# Patient Record
Sex: Male | Born: 1935 | Race: White | Hispanic: No | Marital: Single | Smoking: Never smoker
Health system: Southern US, Community
[De-identification: ages and names within clinical notes are randomized; demographics above are authoritative.]

## PROBLEM LIST (undated history)

## (undated) DIAGNOSIS — G309 Alzheimer's disease, unspecified: Secondary | ICD-10-CM

## (undated) DIAGNOSIS — I493 Ventricular premature depolarization: Secondary | ICD-10-CM

## (undated) DIAGNOSIS — E785 Hyperlipidemia, unspecified: Secondary | ICD-10-CM

## (undated) DIAGNOSIS — I350 Nonrheumatic aortic (valve) stenosis: Secondary | ICD-10-CM

## (undated) DIAGNOSIS — N189 Chronic kidney disease, unspecified: Secondary | ICD-10-CM

## (undated) DIAGNOSIS — I10 Essential (primary) hypertension: Secondary | ICD-10-CM

## (undated) DIAGNOSIS — F329 Major depressive disorder, single episode, unspecified: Secondary | ICD-10-CM

## (undated) DIAGNOSIS — I248 Other forms of acute ischemic heart disease: Secondary | ICD-10-CM

## (undated) DIAGNOSIS — F0280 Dementia in other diseases classified elsewhere without behavioral disturbance: Secondary | ICD-10-CM

## (undated) HISTORY — PX: TRANSURETHRAL RESECTION OF PROSTATE: SHX73

---

## 2013-02-04 ENCOUNTER — Ambulatory Visit: Payer: Self-pay | Admitting: Internal Medicine

## 2015-10-27 ENCOUNTER — Emergency Department: Payer: Medicare Other

## 2015-10-27 ENCOUNTER — Inpatient Hospital Stay
Admission: EM | Admit: 2015-10-27 | Discharge: 2015-11-02 | DRG: 299 | Disposition: A | Discharge status: Hospice | Payer: Medicare Other | Attending: Internal Medicine | Admitting: Internal Medicine

## 2015-10-27 DIAGNOSIS — H409 Unspecified glaucoma: Secondary | ICD-10-CM | POA: Diagnosis present

## 2015-10-27 DIAGNOSIS — L97529 Non-pressure chronic ulcer of other part of left foot with unspecified severity: Secondary | ICD-10-CM | POA: Diagnosis present

## 2015-10-27 DIAGNOSIS — I129 Hypertensive chronic kidney disease with stage 1 through stage 4 chronic kidney disease, or unspecified chronic kidney disease: Secondary | ICD-10-CM | POA: Diagnosis present

## 2015-10-27 DIAGNOSIS — Y95 Nosocomial condition: Secondary | ICD-10-CM | POA: Diagnosis present

## 2015-10-27 DIAGNOSIS — N390 Urinary tract infection, site not specified: Secondary | ICD-10-CM | POA: Diagnosis present

## 2015-10-27 DIAGNOSIS — N179 Acute kidney failure, unspecified: Secondary | ICD-10-CM | POA: Diagnosis present

## 2015-10-27 DIAGNOSIS — D631 Anemia in chronic kidney disease: Secondary | ICD-10-CM | POA: Diagnosis present

## 2015-10-27 DIAGNOSIS — R310 Gross hematuria: Secondary | ICD-10-CM | POA: Diagnosis present

## 2015-10-27 DIAGNOSIS — E860 Dehydration: Secondary | ICD-10-CM | POA: Diagnosis present

## 2015-10-27 DIAGNOSIS — R0600 Dyspnea, unspecified: Secondary | ICD-10-CM | POA: Insufficient documentation

## 2015-10-27 DIAGNOSIS — Z66 Do not resuscitate: Secondary | ICD-10-CM | POA: Diagnosis present

## 2015-10-27 DIAGNOSIS — J984 Other disorders of lung: Secondary | ICD-10-CM | POA: Diagnosis present

## 2015-10-27 DIAGNOSIS — N3289 Other specified disorders of bladder: Secondary | ICD-10-CM | POA: Diagnosis not present

## 2015-10-27 DIAGNOSIS — I82432 Acute embolism and thrombosis of left popliteal vein: Secondary | ICD-10-CM | POA: Diagnosis present

## 2015-10-27 DIAGNOSIS — I10 Essential (primary) hypertension: Secondary | ICD-10-CM | POA: Diagnosis not present

## 2015-10-27 DIAGNOSIS — N401 Enlarged prostate with lower urinary tract symptoms: Secondary | ICD-10-CM | POA: Diagnosis present

## 2015-10-27 DIAGNOSIS — Z515 Encounter for palliative care: Secondary | ICD-10-CM | POA: Diagnosis present

## 2015-10-27 DIAGNOSIS — R0902 Hypoxemia: Secondary | ICD-10-CM

## 2015-10-27 DIAGNOSIS — R338 Other retention of urine: Secondary | ICD-10-CM | POA: Diagnosis present

## 2015-10-27 DIAGNOSIS — F0280 Dementia in other diseases classified elsewhere without behavioral disturbance: Secondary | ICD-10-CM | POA: Diagnosis present

## 2015-10-27 DIAGNOSIS — R1310 Dysphagia, unspecified: Secondary | ICD-10-CM | POA: Diagnosis present

## 2015-10-27 DIAGNOSIS — N138 Other obstructive and reflux uropathy: Secondary | ICD-10-CM | POA: Diagnosis present

## 2015-10-27 DIAGNOSIS — B957 Other staphylococcus as the cause of diseases classified elsewhere: Secondary | ICD-10-CM | POA: Diagnosis present

## 2015-10-27 DIAGNOSIS — Z7901 Long term (current) use of anticoagulants: Secondary | ICD-10-CM | POA: Insufficient documentation

## 2015-10-27 DIAGNOSIS — I82413 Acute embolism and thrombosis of femoral vein, bilateral: Secondary | ICD-10-CM | POA: Diagnosis present

## 2015-10-27 DIAGNOSIS — I350 Nonrheumatic aortic (valve) stenosis: Secondary | ICD-10-CM | POA: Diagnosis present

## 2015-10-27 DIAGNOSIS — G309 Alzheimer's disease, unspecified: Secondary | ICD-10-CM | POA: Diagnosis present

## 2015-10-27 DIAGNOSIS — E785 Hyperlipidemia, unspecified: Secondary | ICD-10-CM | POA: Diagnosis present

## 2015-10-27 DIAGNOSIS — E440 Moderate protein-calorie malnutrition: Secondary | ICD-10-CM | POA: Diagnosis present

## 2015-10-27 DIAGNOSIS — Z882 Allergy status to sulfonamides status: Secondary | ICD-10-CM | POA: Diagnosis not present

## 2015-10-27 DIAGNOSIS — R0989 Other specified symptoms and signs involving the circulatory and respiratory systems: Secondary | ICD-10-CM

## 2015-10-27 DIAGNOSIS — J9600 Acute respiratory failure, unspecified whether with hypoxia or hypercapnia: Secondary | ICD-10-CM | POA: Diagnosis not present

## 2015-10-27 DIAGNOSIS — J9601 Acute respiratory failure with hypoxia: Secondary | ICD-10-CM | POA: Diagnosis not present

## 2015-10-27 DIAGNOSIS — Z79899 Other long term (current) drug therapy: Secondary | ICD-10-CM | POA: Diagnosis not present

## 2015-10-27 DIAGNOSIS — I824Z2 Acute embolism and thrombosis of unspecified deep veins of left distal lower extremity: Secondary | ICD-10-CM | POA: Diagnosis present

## 2015-10-27 DIAGNOSIS — F05 Delirium due to known physiological condition: Secondary | ICD-10-CM | POA: Diagnosis present

## 2015-10-27 DIAGNOSIS — I82403 Acute embolism and thrombosis of unspecified deep veins of lower extremity, bilateral: Secondary | ICD-10-CM | POA: Diagnosis not present

## 2015-10-27 DIAGNOSIS — I493 Ventricular premature depolarization: Secondary | ICD-10-CM | POA: Diagnosis present

## 2015-10-27 DIAGNOSIS — F329 Major depressive disorder, single episode, unspecified: Secondary | ICD-10-CM | POA: Diagnosis present

## 2015-10-27 DIAGNOSIS — J690 Pneumonitis due to inhalation of food and vomit: Secondary | ICD-10-CM | POA: Diagnosis not present

## 2015-10-27 DIAGNOSIS — N183 Chronic kidney disease, stage 3 (moderate): Secondary | ICD-10-CM | POA: Diagnosis present

## 2015-10-27 DIAGNOSIS — Z7982 Long term (current) use of aspirin: Secondary | ICD-10-CM | POA: Diagnosis not present

## 2015-10-27 DIAGNOSIS — J189 Pneumonia, unspecified organism: Secondary | ICD-10-CM

## 2015-10-27 DIAGNOSIS — I248 Other forms of acute ischemic heart disease: Secondary | ICD-10-CM

## 2015-10-27 DIAGNOSIS — N281 Cyst of kidney, acquired: Secondary | ICD-10-CM | POA: Diagnosis present

## 2015-10-27 DIAGNOSIS — G8929 Other chronic pain: Secondary | ICD-10-CM | POA: Diagnosis present

## 2015-10-27 DIAGNOSIS — I5030 Unspecified diastolic (congestive) heart failure: Secondary | ICD-10-CM

## 2015-10-27 DIAGNOSIS — Z8611 Personal history of tuberculosis: Secondary | ICD-10-CM

## 2015-10-27 DIAGNOSIS — R011 Cardiac murmur, unspecified: Secondary | ICD-10-CM | POA: Diagnosis not present

## 2015-10-27 DIAGNOSIS — N19 Unspecified kidney failure: Secondary | ICD-10-CM | POA: Diagnosis present

## 2015-10-27 HISTORY — DX: Alzheimer's disease, unspecified: G30.9

## 2015-10-27 HISTORY — DX: Other forms of acute ischemic heart disease: I24.8

## 2015-10-27 HISTORY — DX: Ventricular premature depolarization: I49.3

## 2015-10-27 HISTORY — DX: Nonrheumatic aortic (valve) stenosis: I35.0

## 2015-10-27 HISTORY — DX: Hyperlipidemia, unspecified: E78.5

## 2015-10-27 HISTORY — DX: Essential (primary) hypertension: I10

## 2015-10-27 HISTORY — DX: Major depressive disorder, single episode, unspecified: F32.9

## 2015-10-27 HISTORY — DX: Dementia in other diseases classified elsewhere, unspecified severity, without behavioral disturbance, psychotic disturbance, mood disturbance, and anxiety: F02.80

## 2015-10-27 HISTORY — DX: Chronic kidney disease, unspecified: N18.9

## 2015-10-27 LAB — URINALYSIS COMPLETE WITH MICROSCOPIC (ARMC ONLY)
BILIRUBIN URINE: NEGATIVE
Bacteria, UA: NONE SEEN
GLUCOSE, UA: NEGATIVE mg/dL
KETONES UR: NEGATIVE mg/dL
NITRITE: NEGATIVE
PH: 5 (ref 5.0–8.0)
Protein, ur: NEGATIVE mg/dL
SPECIFIC GRAVITY, URINE: 1.012 (ref 1.005–1.030)
Squamous Epithelial / LPF: NONE SEEN

## 2015-10-27 LAB — CBC WITH DIFFERENTIAL/PLATELET
Basophils Absolute: 0.1 10*3/uL (ref 0–0.1)
Basophils Relative: 0 %
Eosinophils Absolute: 0.1 10*3/uL (ref 0–0.7)
Eosinophils Relative: 0 %
HEMATOCRIT: 34.8 % — AB (ref 40.0–52.0)
HEMOGLOBIN: 11.7 g/dL — AB (ref 13.0–18.0)
LYMPHS ABS: 1.6 10*3/uL (ref 1.0–3.6)
MCH: 29.1 pg (ref 26.0–34.0)
MCHC: 33.7 g/dL (ref 32.0–36.0)
MCV: 86.4 fL (ref 80.0–100.0)
MONO ABS: 1 10*3/uL (ref 0.2–1.0)
NEUTROS ABS: 13 10*3/uL — AB (ref 1.4–6.5)
PLATELETS: 159 10*3/uL (ref 150–440)
RBC: 4.03 MIL/uL — ABNORMAL LOW (ref 4.40–5.90)
RDW: 14.9 % — AB (ref 11.5–14.5)
WBC: 15.6 10*3/uL — ABNORMAL HIGH (ref 3.8–10.6)

## 2015-10-27 LAB — BASIC METABOLIC PANEL
ANION GAP: 11 (ref 5–15)
BUN: 91 mg/dL — ABNORMAL HIGH (ref 6–20)
CALCIUM: 8.9 mg/dL (ref 8.9–10.3)
CO2: 20 mmol/L — ABNORMAL LOW (ref 22–32)
Chloride: 108 mmol/L (ref 101–111)
Creatinine, Ser: 4.41 mg/dL — ABNORMAL HIGH (ref 0.61–1.24)
GFR, EST AFRICAN AMERICAN: 12 mL/min — AB (ref >60)
GFR, EST NON AFRICAN AMERICAN: 10 mL/min — AB (ref >60)
Glucose, Bld: 119 mg/dL — ABNORMAL HIGH (ref 65–99)
POTASSIUM: 4.9 mmol/L (ref 3.5–5.1)
Sodium: 139 mmol/L (ref 135–145)

## 2015-10-27 LAB — HEPARIN LEVEL (UNFRACTIONATED): HEPARIN UNFRACTIONATED: 0.95 [IU]/mL — AB (ref 0.30–0.70)

## 2015-10-27 LAB — LACTIC ACID, PLASMA: Lactic Acid, Venous: 1.6 mmol/L (ref 0.5–2.0)

## 2015-10-27 LAB — APTT: aPTT: >160 seconds (ref 24–36)

## 2015-10-27 LAB — TROPONIN I
TROPONIN I: 0.08 ng/mL — AB (ref <0.031)
Troponin I: 0.06 ng/mL — ABNORMAL HIGH (ref <0.031)
Troponin I: 0.69 ng/mL — ABNORMAL HIGH (ref <0.031)

## 2015-10-27 LAB — BRAIN NATRIURETIC PEPTIDE: B Natriuretic Peptide: 158 pg/mL — ABNORMAL HIGH (ref 0.0–100.0)

## 2015-10-27 LAB — HEMOGLOBIN
Hemoglobin: 11.5 g/dL — ABNORMAL LOW (ref 13.0–18.0)
Hemoglobin: 12.3 g/dL — ABNORMAL LOW (ref 13.0–18.0)

## 2015-10-27 LAB — PROTIME-INR
INR: 1.42
Prothrombin Time: 17.4 seconds — ABNORMAL HIGH (ref 11.4–15.0)

## 2015-10-27 LAB — PLATELET COUNT: Platelets: 152 10*3/uL (ref 150–440)

## 2015-10-27 LAB — MRSA PCR SCREENING: MRSA BY PCR: NEGATIVE

## 2015-10-27 MED ORDER — POLYETHYLENE GLYCOL 3350 17 G PO PACK
17.0000 g | PACK | Freq: Every day | ORAL | Status: DC | PRN
  Filled 2015-10-27: qty 1

## 2015-10-27 MED ORDER — LAMOTRIGINE 25 MG PO TABS
25.0000 mg | ORAL_TABLET | Freq: Every day | ORAL | Status: DC
  Administered 2015-10-27 – 2015-10-30 (×4): 25 mg via ORAL
  Filled 2015-10-27 (×4): qty 1

## 2015-10-27 MED ORDER — VANCOMYCIN HCL IN DEXTROSE 1-5 GM/200ML-% IV SOLN
1000.0000 mg | INTRAVENOUS | Status: DC
  Filled 2015-10-27: qty 200

## 2015-10-27 MED ORDER — OMEGA-3-ACID ETHYL ESTERS 1 G PO CAPS
1.0000 g | ORAL_CAPSULE | Freq: Every day | ORAL | Status: DC
  Administered 2015-10-27 – 2015-10-31 (×5): 1 g via ORAL
  Filled 2015-10-27 (×5): qty 1

## 2015-10-27 MED ORDER — LATANOPROST 0.005 % OP SOLN
1.0000 [drp] | Freq: Every day | OPHTHALMIC | Status: DC
  Administered 2015-10-27 – 2015-11-01 (×5): 1 [drp] via OPHTHALMIC
  Filled 2015-10-27: qty 2.5

## 2015-10-27 MED ORDER — ONDANSETRON HCL 4 MG/2ML IJ SOLN
4.0000 mg | Freq: Four times a day (QID) | INTRAMUSCULAR | Status: DC | PRN
Start: 2015-10-27 — End: 2015-11-02

## 2015-10-27 MED ORDER — TAMSULOSIN HCL 0.4 MG PO CAPS
0.4000 mg | ORAL_CAPSULE | Freq: Every day | ORAL | Status: DC
  Administered 2015-10-27 – 2015-10-31 (×5): 0.4 mg via ORAL
  Filled 2015-10-27 (×5): qty 1

## 2015-10-27 MED ORDER — DORZOLAMIDE HCL 2 % OP SOLN
1.0000 [drp] | Freq: Two times a day (BID) | OPHTHALMIC | Status: DC
Start: 2015-10-27 — End: 2015-11-02
  Administered 2015-10-27 – 2015-11-02 (×11): 1 [drp] via OPHTHALMIC
  Filled 2015-10-27: qty 10

## 2015-10-27 MED ORDER — MEMANTINE HCL 10 MG PO TABS
10.0000 mg | ORAL_TABLET | Freq: Two times a day (BID) | ORAL | Status: DC
  Administered 2015-10-27 – 2015-10-31 (×9): 10 mg via ORAL
  Filled 2015-10-27 (×10): qty 1

## 2015-10-27 MED ORDER — ZINC SULFATE 220 (50 ZN) MG PO CAPS
220.0000 mg | ORAL_CAPSULE | Freq: Two times a day (BID) | ORAL | Status: DC
  Administered 2015-10-27 – 2015-10-31 (×8): 220 mg via ORAL
  Filled 2015-10-27 (×8): qty 1

## 2015-10-27 MED ORDER — ACETAMINOPHEN 325 MG PO TABS
650.0000 mg | ORAL_TABLET | Freq: Every day | ORAL | Status: DC
  Administered 2015-10-27 – 2015-10-30 (×4): 650 mg via ORAL
  Filled 2015-10-27 (×4): qty 2

## 2015-10-27 MED ORDER — HEPARIN (PORCINE) IN NACL 100-0.45 UNIT/ML-% IJ SOLN
1200.0000 [IU]/h | INTRAMUSCULAR | Status: AC
  Administered 2015-10-27: 1350 [IU]/h via INTRAVENOUS
  Administered 2015-10-28 – 2015-10-29 (×3): 1200 [IU]/h via INTRAVENOUS
  Filled 2015-10-27 (×4): qty 250

## 2015-10-27 MED ORDER — ESCITALOPRAM OXALATE 10 MG PO TABS
10.0000 mg | ORAL_TABLET | Freq: Every day | ORAL | Status: DC
  Administered 2015-10-28: 10 mg via ORAL
  Filled 2015-10-27: qty 1

## 2015-10-27 MED ORDER — SODIUM CHLORIDE 0.9 % IV SOLN
INTRAVENOUS | Status: DC
  Administered 2015-10-27 – 2015-10-31 (×4): via INTRAVENOUS

## 2015-10-27 MED ORDER — SIMVASTATIN 40 MG PO TABS
40.0000 mg | ORAL_TABLET | Freq: Every day | ORAL | Status: DC
  Administered 2015-10-27: 40 mg via ORAL
  Filled 2015-10-27: qty 1

## 2015-10-27 MED ORDER — ADULT MULTIVITAMIN W/MINERALS CH
1.0000 | ORAL_TABLET | Freq: Every day | ORAL | Status: DC
  Administered 2015-10-28 – 2015-10-31 (×4): 1 via ORAL
  Filled 2015-10-27 (×4): qty 1

## 2015-10-27 MED ORDER — SODIUM CHLORIDE 0.9 % IV BOLUS (SEPSIS): 500.0000 mL | Freq: Once | INTRAVENOUS | Status: DC

## 2015-10-27 MED ORDER — ACETAMINOPHEN 325 MG PO TABS
650.0000 mg | ORAL_TABLET | Freq: Four times a day (QID) | ORAL | Status: DC | PRN
  Administered 2015-11-01 (×2): 650 mg via ORAL
  Filled 2015-10-27 (×2): qty 2

## 2015-10-27 MED ORDER — HEPARIN BOLUS VIA INFUSION
4000.0000 [IU] | Freq: Once | INTRAVENOUS | Status: AC
  Administered 2015-10-27: 4000 [IU] via INTRAVENOUS
  Filled 2015-10-27: qty 4000

## 2015-10-27 MED ORDER — SODIUM CHLORIDE 0.9% FLUSH
3.0000 mL | Freq: Two times a day (BID) | INTRAVENOUS | Status: DC
  Administered 2015-10-28 – 2015-11-01 (×8): 3 mL via INTRAVENOUS

## 2015-10-27 MED ORDER — AMLODIPINE BESYLATE 5 MG PO TABS
5.0000 mg | ORAL_TABLET | Freq: Every day | ORAL | Status: DC
  Administered 2015-10-28 – 2015-10-31 (×4): 5 mg via ORAL
  Filled 2015-10-27 (×4): qty 1

## 2015-10-27 MED ORDER — ACETAMINOPHEN 650 MG RE SUPP: 650.0000 mg | Freq: Four times a day (QID) | RECTAL | Status: DC | PRN

## 2015-10-27 MED ORDER — PIPERACILLIN-TAZOBACTAM 3.375 G IVPB 30 MIN
3.3750 g | Freq: Once | INTRAVENOUS | Status: DC
  Filled 2015-10-27: qty 50

## 2015-10-27 MED ORDER — DEXTROSE 5 % IV SOLN
1.0000 g | INTRAVENOUS | Status: DC
  Administered 2015-10-27 – 2015-10-28 (×2): 1 g via INTRAVENOUS
  Filled 2015-10-27 (×3): qty 10

## 2015-10-27 MED ORDER — SENNOSIDES-DOCUSATE SODIUM 8.6-50 MG PO TABS
1.0000 | ORAL_TABLET | Freq: Every evening | ORAL | Status: DC | PRN
  Administered 2015-10-30: 1 via ORAL
  Filled 2015-10-27: qty 1

## 2015-10-27 MED ORDER — ONDANSETRON HCL 4 MG PO TABS: 4.0000 mg | ORAL_TABLET | Freq: Four times a day (QID) | ORAL | Status: DC | PRN

## 2015-10-27 NOTE — Progress Notes (Signed)
Pharmacy Antibiotic Note  Maurice Carney is a 82 y.o. male admitted on 10/27/2015 with UTI.  Pharmacy has been consulted for ceftriaxone dosing.  Plan: Ceftriaxone 1 g IV q24h   Height: 6\' 4"  (193 cm) Weight: 175 lb (79.379 kg) IBW/kg (Calculated) : 86.8  Temp (24hrs), Avg:98.2 F (36.8 C), Min:98.2 F (36.8 C), Max:98.2 F (36.8 C)   Recent Labs Lab 10/27/15 0925  WBC 15.6*  CREATININE 4.41*  LATICACIDVEN 1.6    Estimated Creatinine Clearance: 10.8 mL/min (by C-G formula based on Cr of 4.41).    Allergies  Allergen Reactions  . Sulfa Antibiotics    Antimicrobials this admission: ceftriaxone 5/26 >>   Dose adjustments this admission:  Microbiology results: 5/26 BCx: Sent 5/26 UCx: Sent   Thank you for allowing pharmacy to be a part of this patient's care.  Cindi CarbonMary M Ronte Parker, PharmD Clinical Pharmacist 10/27/2015 12:41 PM

## 2015-10-27 NOTE — ED Notes (Signed)
Pt came to ED via EMS from [place]. Pt has chronic back pain, but reports it has increasingly gotten worse. Pt has strong urine odor.

## 2015-10-27 NOTE — Progress Notes (Signed)
Patient with >1000 cc urine in bladder [facility] placed [facility]

## 2015-10-27 NOTE — Progress Notes (Signed)
Patient has bloody urine output. Notified Dr. Juliene PinaMody. No new orders at this will continue to monitor. Patient daughter concerned with patient aspirating. Patient's family requesting a speech swallow eval. MD made aware. MD will place orders. Rudean Haskellonnisha R Imari Sivertsen

## 2015-10-27 NOTE — Consult Note (Signed)
[facility] Vascular Consult Note  MRN : [id number]  Maurice Carney is a 82 y.o. (March 31, 1917) male who presents with chief complaint of  Chief Complaint  Patient presents with  . Back Pain  .  History of Present Illness: I am asked to see the patient by Dr. Juliene Pina regarding an extensive left lower extremity DVT and consideration for IVC filter. I am also asked to comment on some small ulcerations on his left second third toes. His pain and swelling in his left leg started a few days ago in this regard. He has been having trouble with his legs for many years he states. His walking has gotten much worse over the past several years this point when asked how much walking he does he says not much. It sounds like he has become nonambulatory. He is accompanied by his grandson today and the patient provide some history reasonably well. He has no chest pain or shortness of breath. He came in for back pain and was found to have severe urinary retention with over a liter of urine in his bladder. He was in acute on chronic renal failure as well. A duplex was performed which showed a very extensive left lower extremity DVT and he has been started on anticoagulation. At the time of diagnosis he was not on any anticoagulants. He has no evidence of pulmonary embolus and does not appear to have poor pulmonary reserve.  Current Facility-Administered Medications  Medication Dose Route Frequency Provider Last Rate Last Dose  . 0.9 %  sodium chloride infusion   Intravenous Continuous Adrian Saran, MD 75 mL/hr at 10/27/15 1236    . acetaminophen (TYLENOL) tablet 650 mg  650 mg Oral Q6H PRN Adrian Saran, MD       Or  . acetaminophen (TYLENOL) suppository 650 mg  650 mg Rectal Q6H PRN Adrian Saran, MD      . acetaminophen (TYLENOL) tablet 650 mg  650 mg Oral QHS Sital Mody, MD      . amLODipine (NORVASC) tablet 5 mg  5 mg Oral Daily Adrian Saran, MD   5 mg at 10/27/15 1626  . cefTRIAXone (ROCEPHIN) 1 g  in dextrose 5 % 50 mL IVPB  1 g Intravenous Q24H Cindi Carbon, [facility]   Stopped at 10/27/15 1522  . dorzolamide (TRUSOPT) 2 % ophthalmic solution 1 drop  1 drop Both Eyes BID Sital Mody, MD      . escitalopram (LEXAPRO) tablet 10 mg  10 mg Oral Daily Sital Mody, MD   10 mg at 10/27/15 1626  . heparin ADULT infusion 100 units/mL (25000 units/211mL sodium chloride 0.45%)  1,350 Units/hr Intravenous Continuous Richardean Canal, MD 13.5 mL/hr at 10/27/15 1142 1,350 Units/hr at 10/27/15 1142  . lamoTRIgine (LAMICTAL) tablet 25 mg  25 mg Oral QHS Sital Mody, MD      . latanoprost (XALATAN) 0.005 % ophthalmic solution 1 drop  1 drop Both Eyes QHS Sital Mody, MD      . memantine (NAMENDA) tablet 10 mg  10 mg Oral BID Adrian Saran, MD   10 mg at 10/27/15 1535  . multivitamin with minerals tablet 1 tablet  1 tablet Oral Daily Adrian Saran, MD   1 tablet at 10/27/15 1628  . omega-3 acid ethyl esters (LOVAZA) capsule 1 g  1 g Oral Daily Adrian Saran, MD   1 g at 10/27/15 1535  . ondansetron (ZOFRAN) tablet 4 mg  4 mg Oral Q6H PRN Adrian Saran, MD  Or  . ondansetron (ZOFRAN) injection 4 mg  4 mg Intravenous Q6H PRN Sital Mody, MD      . polyethylene glycol (MIRALAX / GLYCOLAX) packet 17 g  17 g Oral Daily PRN Adrian Saran, MD      . senna-docusate (Senokot-S) tablet 1 tablet  1 tablet Oral QHS PRN Adrian Saran, MD      . simvastatin (ZOCOR) tablet 40 mg  40 mg Oral QHS Sital Mody, MD      . sodium chloride flush (NS) 0.9 % injection 3 mL  3 mL Intravenous Q12H Sital Mody, MD   3 mL at 10/27/15 1200  . tamsulosin (FLOMAX) capsule 0.4 mg  0.4 mg Oral Daily Sital Mody, MD      . zinc sulfate capsule 220 mg  220 mg Oral BID Adrian Saran, MD   220 mg at 10/27/15 1630    Past Medical History  Diagnosis Date  . Hypertension   . Hyperlipidemia   . Alzheimer disease   . MDD (major depressive disorder) (HCC)   . CKD (chronic kidney disease)     History reviewed. No pertinent past surgical history.  Social History Social  History  Substance Use Topics  . Smoking status: Never Smoker   . Smokeless tobacco: None  . Alcohol Use: No  No IVDU  Family History No known family history of bleeding disorders, clotting disorders, or aneurysms  Allergies  Allergen Reactions  . Sulfa Antibiotics      REVIEW OF SYSTEMS (Negative unless checked)  Constitutional: [] Weight loss  [] Fever  [] Chills Cardiac: [] Chest pain   [] Chest pressure   [] Palpitations   [] Shortness of breath when laying flat   [] Shortness of breath at rest   [] Shortness of breath with exertion. Vascular:  [] Pain in legs with walking   [] Pain in legs at rest   [] Pain in legs when laying flat   [] Claudication   [] Pain in feet when walking  [] Pain in feet at rest  [] Pain in feet when laying flat   [x] History of DVT   [] Phlebitis   [x] Swelling in legs   [] Varicose veins   [] Non-healing ulcers Pulmonary:   [] Uses home oxygen   [] Productive cough   [] Hemoptysis   [] Wheeze  [] COPD   [] Asthma Neurologic:  [] Dizziness  [] Blackouts   [] Seizures   [] History of stroke   [] History of TIA  [] Aphasia   [] Temporary blindness   [] Dysphagia   [] Weakness or numbness in arms   [] Weakness or numbness in legs Musculoskeletal:  [] Arthritis   [] Joint swelling   [] Joint pain   [] Low back pain Hematologic:  [] Easy bruising  [] Easy bleeding   [] Hypercoagulable state   [] Anemic  [] Hepatitis Gastrointestinal:  [] Blood in stool   [] Vomiting blood  [] Gastroesophageal reflux/heartburn   [] Difficulty swallowing. Genitourinary:  [x] Chronic kidney disease   [] Difficult urination  [] Frequent urination  [] Burning with urination   [x] Blood in urine Skin:  [] Rashes   [x] Ulcers   [] Wounds Psychological:  [] History of anxiety   []  History of major depression.  Physical Examination  Filed Vitals:   10/27/15 1300 10/27/15 1400 10/27/15 1500 10/27/15 1612  BP: 177/71 163/68 162/69 155/61  Pulse: 78   92  Temp:    97.5 F (36.4 C)  TempSrc:    Oral  Resp: 21 20 21 24   Height:       Weight:      SpO2: 96%   88%   Body mass index is 21.31 kg/(m^2). Gen:  WD/WN, NAD Head:  [place]/AT, No temporalis wasting. Prominent temp pulse not noted. Ear/Nose/Throat: Hearing grossly intact, nares w/o erythema or drainage, oropharynx w/o Erythema/Exudate Eyes: PERRLA, EOMI.  Neck: Supple, no nuchal rigidity.  No JVD.  Pulmonary:  Good air movement, equal bilaterally.  Cardiac: RRR, normal S1, S2 Vascular:  Vessel Right Left  Radial Palpable Palpable  Ulnar Palpable Palpable  Brachial Palpable Palpable  Carotid Palpable, without bruit Palpable, without bruit  Aorta Not palpable N/A  Femoral Palpable Palpable  Popliteal Palpable Not Palpable  PT Not Palpable Not Palpable  DP Palpable Weakly Palpable   Gastrointestinal: soft, non-tender/non-distended. No guarding/reflex. No masses, surgical incisions, or scars. Musculoskeletal: M/S 5/5 throughout.  Extremities without ischemic changes.  No deformity or atrophy. 3+ LLE edema.  No RLE edema. Neurologic: CN 2-12 intact. Pain and light touch intact in extremities.  Symmetrical.  Speech is fluent. Motor exam as listed above. Psychiatric: Judgment intact, Mood & affect appropriate for pt's clinical situation. Dermatologic: small shallow ulcers on the tips of toes two and three on the left foot Lymph : No Cervical, Axillary, or Inguinal lymphadenopathy.    CBC Lab Results  Component Value Date   WBC 15.6* 10/27/2015   HGB 11.5* 10/27/2015   HCT 34.8* 10/27/2015   MCV 86.4 10/27/2015   PLT 152 10/27/2015    BMET    Component Value Date/Time   NA 139 10/27/2015 0925   K 4.9 10/27/2015 0925   CL 108 10/27/2015 0925   CO2 20* 10/27/2015 0925   GLUCOSE 119* 10/27/2015 0925   BUN 91* 10/27/2015 0925   CREATININE 4.41* 10/27/2015 0925   CALCIUM 8.9 10/27/2015 0925   GFRNONAA 10* 10/27/2015 0925   GFRAA 12* 10/27/2015 0925   Estimated Creatinine Clearance: 10.8 mL/min (by C-G formula based on Cr of 4.41).  COAG Lab Results   Component Value Date   INR 1.42 10/27/2015    Radiology Dg Chest 2 View  10/27/2015  CLINICAL DATA:  Worsening back pain. Shortness of breath. Leukocytosis. EXAM: CHEST  2 VIEW COMPARISON:  None, including search under other medical record numbers. FINDINGS: There is extensive opacification of right chest with pleural calcification, pleural thickening, and volume loss. The left lung is hyperinflated but clear. No convincing left-sided pleural calcification right-sided opacity could all be chronic, but cannot exclude pneumonia, complex pleural effusion, or malignancy. Normal heart size and mediastinal contours. IMPRESSION: Extensive opacification of the right chest, constellation of findings suggesting fibrothorax. Cannot exclude superimposed acute or malignant pleural parenchymal disease and chest CT is suggested. Electronically Signed   By: Marnee Spring M.D.   On: 10/27/2015 10:36   Dg Lumbar Spine Complete  10/27/2015  CLINICAL DATA:  Worsening low back pain and shortness-of-breath today. EXAM: LUMBAR SPINE - COMPLETE 4+ VIEW COMPARISON:  None. FINDINGS: Mild curvature of the lumbar spine convex to the right. Vertebral body heights are within normal. There is moderate spondylosis throughout the lumbar spine to include facet arthropathy. Moderate disc space narrowing at the L2-3, L3-4 L4-5 levels. Significant calcified plaque over the abdominal aorta and iliac arteries. IMPRESSION: Moderate spondylosis of the lumbar spine with significant disc disease from the L2-3 level to the L4-5 level. Mild curvature convex right. Electronically Signed   By: Elberta Fortis M.D.   On: 10/27/2015 10:46   US Venous Img Lower Unilateral Left  10/27/2015  CLINICAL DATA:  Left leg swelling. EXAM: LEFT LOWER EXTREMITY VENOUS DOPPLER ULTRASOUND TECHNIQUE: Gray-scale sonography with graded compression, as well as color Doppler and duplex ultrasound  were performed to evaluate the lower extremity deep venous systems from  the level of the common femoral vein and including the common femoral, femoral, profunda femoral, popliteal and calf veins including the posterior tibial, peroneal and gastrocnemius veins when visible. The superficial great saphenous vein was also interrogated. Spectral Doppler was utilized to evaluate flow at rest and with distal augmentation maneuvers in the common femoral, femoral and popliteal veins. COMPARISON:  None. FINDINGS: Contralateral Common Femoral Vein: Occlusive thrombus noted within the right common femoral vein. Common Femoral Vein: Occlusive thrombus noted. Saphenofemoral Junction: Occlusive thrombus noted. Profunda Femoral Vein: Occlusive thrombus noted. Femoral Vein: Occlusive thrombus noted. Popliteal Vein: A occlusive thrombus noted. Calf Veins: Occlusive thrombus noted. Superficial Great Saphenous Vein: Non a well visualized. Venous Reflux:  None. Other Findings:  None. IMPRESSION: Extensive occlusive thrombus throughout the left lower extremity venous system. Occlusive thrombus is also noted in the contralateral right common femoral vein. These results will be called to the ordering clinician or representative by the Radiologist Assistant, and communication documented in the PACS or zVision Dashboard. Electronically Signed   By: Charlett NoseKevin  Dover M.D.   On: 10/27/2015 10:16      Assessment/Plan 1. Extensive LLE DVT.  Developed this from prolonged immobility and he is not really walking now so I'm not sure what his fall risk is. He was not on anticoagulation at the time of DVT. At this point, he is on heparin. That would be appropriate therapy in a patient with a new DVT that was provoked and not on anticoagulation at the time of diagnosis with no clear pulmonary embolus or poor pulmonary reserve. I would recommend continued heparin. Given the extensive swelling and discomfort in his leg, he should be on anticoagulation if at all possible even if only for a short period of time. If he  develops bleeding well on anticoagulation and IVC filter should then be placed. If it is determined going for that he is more of a fall risk a filter may have to be placed in the future as well. 2. Left lower extremity pain and swelling. From #1. Continue elevation and once he has been on anticoagulation for 48 hours consider compression socks. 3. Renal failure. Acute on chronic. Hard to imagine a 82 year old would do good with dialysis so hopefully this will improve. 4. Small ulcerations on the tips of toes 2 and 3 on the left foot. His pronounced swelling from his DVT may be contributing as well. These are in the area of arterial insufficiency ulcers, but at this point with his renal failure angiography would be contraindicated. I'll be happy to see him in the office for noninvasive studies for further evaluation.   DEW,JASON, MD  10/27/2015 5:54 PM

## 2015-10-27 NOTE — Progress Notes (Signed)
Nurse reports that patient with hematuria. Had trauma during FOLEY insettion. Will check hgb and if he continues to have hematuria then will need to D?C heparin gtt. AWAITING VASC consult. Will page them to discuss.

## 2015-10-27 NOTE — Progress Notes (Signed)
Notified pharmacy of aPTT level >160. Per pharmacy will redraw labs and adjust levels as needed. Will continue to monitor. Maurice Carney

## 2015-10-27 NOTE — Progress Notes (Signed)
PT Cancellation Note  Patient Details Name: Maurice Carney MRN: [id number] DOB: 07-01-1916   Cancelled Treatment:    Reason Eval/Treat Not Completed: Patient declined, no reason specified Pt is laying in bed with family present.  They all agree that given how weak he is and how sore his legs remain from DTVs that he should wait until tomorrow for eval.  [name] [name], DPT 10/27/2015, 4:58 PM
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
## 2015-10-27 NOTE — NC FL2 (Signed)
[place] MEDICAID FL2 LEVEL OF CARE SCREENING TOOL     IDENTIFICATION  Patient Name: Maurice Carney Birthdate: [birth year] Sex: male Admission Date (Current Location): 10/27/2015  [address] and [place] Number:  Chiropodist and Address:  [facility], [address], [place], [place] [address]      Provider Number: [id number]  Attending Physician Name and Address:  Adrian Saran, MD  Relative Name and Phone Number:       Current Level of Care: Hospital Recommended Level of Care: Skilled Nursing Facility, Assisted Living Facility Prior Approval Number:    Date Approved/Denied:   PASRR Number:  [id number]  Discharge Plan: SNF    Current Diagnoses: Patient Active Problem List   Diagnosis Date Noted  . Renal failure 10/27/2015    Orientation RESPIRATION BLADDER Height & Weight     Self, Time, Situation, Place  Normal Indwelling catheter Weight: 175 lb (79.379 kg) Height:   (193 cm)  BEHAVIORAL SYMPTOMS/MOOD NEUROLOGICAL BOWEL NUTRITION STATUS      Incontinent Diet (normal)  AMBULATORY STATUS COMMUNICATION OF NEEDS Skin   Extensive Assist Verbally Normal                       Personal Care Assistance Level of Assistance  Bathing, Feeding, Dressing, Total care Bathing Assistance: Maximum assistance Feeding assistance: Limited assistance Dressing Assistance: Maximum assistance Total Care Assistance: Maximum assistance   Functional Limitations Info  Sight, Hearing, Speech Sight Info: Impaired Hearing Info: Adequate Speech Info: Adequate    SPECIAL CARE FACTORS FREQUENCY                       Contractures      Additional Factors Info  Allergies   Allergies Info: Sulpha antibiotics           Current Medications (10/27/2015):  This is the current hospital active medication list Current Facility-Administered Medications  Medication Dose Route Frequency Provider Last Rate Last Dose  . 0.9 %  sodium chloride  infusion   Intravenous Continuous Adrian Saran, MD 75 mL/hr at 10/27/15 1236    . acetaminophen (TYLENOL) tablet 650 mg  650 mg Oral Q6H PRN Adrian Saran, MD       Or  . acetaminophen (TYLENOL) suppository 650 mg  650 mg Rectal Q6H PRN Adrian Saran, MD      . acetaminophen (TYLENOL) tablet 650 mg  650 mg Oral QHS Sital Mody, MD      . amLODipine (NORVASC) tablet 5 mg  5 mg Oral Daily Sital Mody, MD      . cefTRIAXone (ROCEPHIN) 1 g in dextrose 5 % 50 mL IVPB  1 g Intravenous Q24H Cindi Carbon, RPH 100 mL/hr at 10/27/15 1231 1 g at 10/27/15 1231  . dorzolamide (TRUSOPT) 2 % ophthalmic solution 1 drop  1 drop Both Eyes BID Sital Mody, MD      . escitalopram (LEXAPRO) tablet 10 mg  10 mg Oral Daily Sital Mody, MD      . heparin ADULT infusion 100 units/mL (25000 units/236mL sodium chloride 0.45%)  1,350 Units/hr Intravenous Continuous Richardean Canal, MD 13.5 mL/hr at 10/27/15 1142 1,350 Units/hr at 10/27/15 1142  . lamoTRIgine (LAMICTAL) tablet 25 mg  25 mg Oral QHS Sital Mody, MD      . latanoprost (XALATAN) 0.005 % ophthalmic solution 1 drop  1 drop Both Eyes QHS Adrian Saran, MD      . memantine (NAMENDA) tablet  10 mg  10 mg Oral BID Adrian SaranSital Mody, MD      . multivitamin with minerals tablet 1 tablet  1 tablet Oral Daily Sital Mody, MD      . omega-3 acid ethyl esters (LOVAZA) capsule 1 g  1 g Oral Daily Sital Mody, MD      . ondansetron (ZOFRAN) tablet 4 mg  4 mg Oral Q6H PRN Adrian SaranSital Mody, MD       Or  . ondansetron (ZOFRAN) injection 4 mg  4 mg Intravenous Q6H PRN Sital Mody, MD      . polyethylene glycol (MIRALAX / GLYCOLAX) packet 17 g  17 g Oral Daily PRN Adrian SaranSital Mody, MD      . senna-docusate (Senokot-S) tablet 1 tablet  1 tablet Oral QHS PRN Adrian SaranSital Mody, MD      . simvastatin (ZOCOR) tablet 40 mg  40 mg Oral QHS Sital Mody, MD      . sodium chloride 0.9 % bolus 500 mL  500 mL Intravenous Once Richardean Canalavid H Yao, MD      . sodium chloride flush (NS) 0.9 % injection 3 mL  3 mL Intravenous Q12H Sital Mody, MD       . tamsulosin (FLOMAX) capsule 0.4 mg  0.4 mg Oral Daily Sital Mody, MD      . zinc sulfate capsule 220 mg  220 mg Oral BID Adrian SaranSital Mody, MD       Current Outpatient Prescriptions  Medication Sig Dispense Refill  . acetaminophen (TYLENOL) 325 MG tablet Take 650 mg by mouth at bedtime.    Marland Kitchen. amLODipine (NORVASC) 5 MG tablet Take 5 mg by mouth daily.    Marland Kitchen. aspirin EC 81 MG tablet Take 81 mg by mouth daily.    . dorzolamide (TRUSOPT) 2 % ophthalmic solution Place 1 drop into both eyes 2 (two) times daily.    Marland Kitchen. escitalopram (LEXAPRO) 10 MG tablet Take 10 mg by mouth daily.    Marland Kitchen. lamoTRIgine (LAMICTAL) 25 MG tablet Take 25 mg by mouth daily.    Marland Kitchen. latanoprost (XALATAN) 0.005 % ophthalmic solution Place 1 drop into both eyes at bedtime.    . memantine (NAMENDA) 10 MG tablet Take 10 mg by mouth 2 (two) times daily.    . Multiple Vitamin (MULTIVITAMIN WITH MINERALS) TABS tablet Take 1 tablet by mouth daily.    Marland Kitchen. neomycin-bacitracin-polymyxin (NEOSPORIN) ointment Apply 1 application topically daily. Apply to left 3rd toe daily until healed.    Marland Kitchen. omega-3 acid ethyl esters (LOVAZA) 1 g capsule Take 1 g by mouth daily.    . polyethylene glycol (MIRALAX / GLYCOLAX) packet Take 17 g by mouth daily as needed for mild constipation or moderate constipation.    . simvastatin (ZOCOR) 40 MG tablet Take 40 mg by mouth at bedtime.    Marland Kitchen. zinc sulfate 220 (50 Zn) MG capsule Take 220 mg by mouth 2 (two) times daily.       Discharge Medications: Please see discharge summary for a list of discharge medications.  Relevant Imaging Results:  Relevant Lab Results:   Additional Information SSN [id number]  Arrie SenateBandi, Tynika Luddy RioM, [place]

## 2015-10-27 NOTE — Clinical Social Work Note (Signed)
Clinical Social Work Assessment  Patient Details  Name: Maurice Carney MRN: [id number] Date of Birth: Mar 31, 1917  Date of referral:  10/27/15               Reason for consult:  Facility Placement                Permission sought to share information with:  Family Supports, Customer service manager Permission granted to share information::  Yes, Verbal Permission Granted  Name::     Daughter Sharene Butters [phone]    Agency::  [facility]   Relationship::  yes  Contact Information:  yes  Housing/Transportation Living arrangements for the past 2 months:  [place] of Information:  Patient, Adult Children Patient Interpreter Needed:  None Criminal Activity/Legal Involvement Pertinent to Current Situation/Hospitalization:  No - Comment as needed Significant Relationships:  Adult Children, Spouse Lives with:  Spouse Do you feel safe going back to the place where you live?  Yes Need for family participation in patient care:  Yes (Comment)  Care giving concerns: patient is unable to walk and complained of pain in legs. He was very active up to a week ago.   Social Worker assessment / plan: LCSW met with patient who gave verbal consent to speak to family and facility. He is residing with [facility] with his spouse( she has dementia) Combined patient and wife have support for all their ADL's. Patient in incontinent  (  Temp catheter being placed)A week ago patient was able to walk with his cane/walker now he reports he is weak ,unable to walk and pain in legs. He has good family support.  He has poor eyesight, hard of hearing but able to answer questions well. He is oriented to person,place, situation. His insurance is [employer] operational officer. ( patient being admitted)  Employment status:  Retired Forensic scientist:  Medicare (AARP/Medicare) PT Recommendations:  Not assessed at this time Information / Referral to community resources:  [place]  Patient/Family's Response to care: Would like for his health issues to be looked after  Patient/Family's Understanding of and Emotional Response to Diagnosis, Current Treatment, and Prognosis:  They understand he is having issues with his bladder and decreased appetite and mobility.  Emotional Assessment Appearance:  Appears younger than stated age Attitude/Demeanor/Rapport:   (Polite and oriented x3) Affect (typically observed):  Accepting, Calm Orientation:  Oriented to Self, Oriented to Place, Oriented to  Time, Oriented to Situation (at times forgetful) Alcohol / Substance use:  Never Used Psych involvement (Current and /or in the community):  No (Comment)  Discharge Needs  Concerns to be addressed:  Care Coordination Readmission within the last 30 days:  No Current discharge risk:  None Barriers to Discharge:  Continued Medical Work up   Pompeys Pillar, [place], LCSW 10/27/2015, 1:56 PM

## 2015-10-27 NOTE — Progress Notes (Signed)
ANTICOAGULATION CONSULT NOTE   Pharmacy Consult for Heparin Drip Indication: VTE prophylaxis  Allergies  Allergen Reactions  . Sulfa Antibiotics     Patient Measurements: Height: 6\' 4"  (193 cm) Weight: 175 lb (79.379 kg) IBW/kg (Calculated) : 86.8 Heparin Dosing Weight: 79 kg  Vital Signs: Temp: 97.5 F (36.4 C) (05/26 1612) Temp Source: Oral (05/26 1612) BP: 155/61 mmHg (05/26 1612) Pulse Rate: 92 (05/26 1612)  Labs:  Recent Labs  10/27/15 0925 10/27/15 1618 10/27/15 1830  HGB 11.7* 11.5* 12.3*  HCT 34.8*  --   --   PLT 159 152  --   APTT  --  >160*  --   LABPROT  --  17.4*  --   INR  --  1.42  --   HEPARINUNFRC  --   --  0.95*  CREATININE 4.41*  --   --   TROPONINI 0.06* 0.08*  --     Estimated Creatinine Clearance: 10.8 mL/min (by C-G formula based on Cr of 4.41).   Medical History: Past Medical History  Diagnosis Date  . Hypertension   . Hyperlipidemia   . Alzheimer disease   . MDD (major depressive disorder) (HCC)   . CKD (chronic kidney disease)     Medications:  Scheduled:  . acetaminophen  650 mg Oral QHS  . amLODipine  5 mg Oral Daily  . cefTRIAXone (ROCEPHIN)  IV  1 g Intravenous Q24H  . dorzolamide  1 drop Both Eyes BID  . escitalopram  10 mg Oral Daily  . lamoTRIgine  25 mg Oral QHS  . latanoprost  1 drop Both Eyes QHS  . memantine  10 mg Oral BID  . multivitamin with minerals  1 tablet Oral Daily  . omega-3 acid ethyl esters  1 g Oral Daily  . simvastatin  40 mg Oral QHS  . sodium chloride flush  3 mL Intravenous Q12H  . tamsulosin  0.4 mg Oral Daily  . zinc sulfate  220 mg Oral BID   Infusions:  . sodium chloride 75 mL/hr at 10/27/15 1236  . heparin 1,350 Units/hr (10/27/15 1142)    Assessment: 82 yo male with bilateral DVT's ordered heparin drip for possible PE's. Patient receiving heparin 1350units/hr.   Goal of Therapy:  Heparin level 0.3-0.7 units/ml    Plan:  Will decrease rate by ~2 units/kg/hr to 1200 units/hr.  Will schedule next anti-Xa level for 0330 on 5/27.   Pharmacy will continue to monitor and adjust per consult.   Simpson,Michael L, RPh 10/27/2015,7:12 PM

## 2015-10-27 NOTE — Progress Notes (Signed)
Maurice Carney is a 82 y.o. male patient admitted from ED awake, alert - oriented  X 4 - no acute distress noted.  VSS - Blood pressure 155/61, pulse 92, temperature 97.5 F (36.4 C), temperature source Oral, resp. rate 24, height 6\' 4"  (1.93 m), weight 79.379 kg (175 lb), SpO2 88 %.    IV in place, occlusive dsg intact without redness.  Orientation to room, and floor completed with information packet given to patient/family.  Admission INP armband ID verified with patient/family, and in place.   SR up x 2, fall assessment complete, with patient and family able to verbalize understanding of risk associated with falls, and verbalized understanding to call nsg before up out of bed.  Call light within reach, patient able to voice, and demonstrate understanding.  Skin, clean-dry- intact without evidence of bruising, or skin tears.   No evidence of skin break down noted on exam. Skin assessed with Maddie H. Tele box verification with Olegario MessierKathy, RN and Maddie, RN      Will cont to eval and treat per MD orders.  Rudean Haskellonnisha R Loise Esguerra, RN 10/27/2015 5:27 PM

## 2015-10-27 NOTE — Progress Notes (Signed)
ANTICOAGULATION CONSULT NOTE - Initial Consult  Pharmacy Consult for Heparin Drip Indication: VTE prophylaxis  Allergies  Allergen Reactions  . Sulfa Antibiotics     Patient Measurements: Height: 6\' 4"  (193 cm) Weight: 175 lb (79.379 kg) IBW/kg (Calculated) : 86.8 Heparin Dosing Weight: 79 kg  Vital Signs: Temp: 98.2 F (36.8 C) (05/26 0905) BP: 162/71 mmHg (05/26 1130) Pulse Rate: 76 (05/26 1130)  Labs:  Recent Labs  10/27/15 0925  HGB 11.7*  HCT 34.8*  PLT 159  CREATININE 4.41*  TROPONINI 0.06*    Estimated Creatinine Clearance: 10.8 mL/min (by C-G formula based on Cr of 4.41).   Medical History: Past Medical History  Diagnosis Date  . Hypertension   . Hyperlipidemia   . Alzheimer disease   . MDD (major depressive disorder) (HCC)   . CKD (chronic kidney disease)     Medications:  Scheduled:   Infusions:  . heparin 1,350 Units/hr (10/27/15 1142)  . piperacillin-tazobactam    . sodium chloride    . vancomycin      Assessment: 82 yo male with bilateral DVT's ordered heparin drip for possible PE's.   Goal of Therapy:  Heparin level 0.3-0.7 units/ml    Plan:  Give 4000 units bolus x 1 Start heparin infusion at 1350 units/hr Check anti-Xa level in 8 hours and daily while on heparin Continue to monitor H&H and platelets   Ordered HL for 5/26 at 19:30.  Jeriyah Granlund K, RPh 10/27/2015,11:45 AM

## 2015-10-27 NOTE — H&P (Signed)
[facility] - [place] [facility]   PATIENT NAME: Maurice Carney    MR#:  [id number]  DATE OF BIRTH:  1916/09/01  DATE OF ADMISSION:  10/27/2015  PRIMARY CARE PHYSICIAN: Dr making house calls REQUESTING/REFERRING PHYSICIAN:  Dr Artist Pais  CHIEF COMPLAINT:   Lower extremity edema and back pain HISTORY OF PRESENT ILLNESS:  Maurice Carney  is a 82 y.o. male with a known history of All tremors dementia, chronic back pain and essential hypertension who presents with above complaint. History of present illness is taken from daughter who is at bedside. Patient moved here a few months ago and is now living at assisted living. Yesterday patient was having shortness of breath and daughter noticed that his lower extremities were very edematous. His left lower external he was more edematous than the right. He was given a dose of 40 mg of Lasix by his physician from doctors making house calls. Due to the lower external he swelling and shortness of breath and weakness he presents today along with back pain. Patient reports that his back pain is now controlled. He says he has had back pain for 50 years. Daughter also reports that his oxygen saturation was 85% on room air yesterday. After the dose of Lasix and elevating his legs yesterday his oxygen saturation was 92%. Today in the emergency department his O2 saturations are 96-100%. Lower extremity Dopplers were performed which were positive for DVT. He has not been very ambulatory for the past few months. Patient has a history of tuberculosis and has been treated in the past. He has chronic scarring of the right lung. He is noted to have urinary tract infection on UA today.  PAST MEDICAL HISTORY:   Past Medical History  Diagnosis Date  . Hypertension   . Hyperlipidemia   . Alzheimer disease   . MDD (major depressive disorder) (HCC)   . CKD (chronic kidney disease)     PAST SURGICAL HISTORY:  None reported  SOCIAL HISTORY:    Social History  Substance Use Topics  . Smoking status: No   . Smokeless tobacco: No  . Alcohol Use: No    FAMILY HISTORY:  No reported history of hypertension or diabetes  DRUG ALLERGIES:   Allergies  Allergen Reactions  . Sulfa Antibiotics     REVIEW OF SYSTEMS:   Review of Systems  Constitutional: Negative for fever, chills and malaise/fatigue.  HENT: Negative for ear discharge, ear pain, hearing loss, nosebleeds and sore throat.   Eyes: Negative for blurred vision and pain.  Respiratory: Negative for cough, hemoptysis, shortness of breath and wheezing.   Cardiovascular: Positive for leg swelling. Negative for chest pain and palpitations.  Gastrointestinal: Negative for nausea, vomiting, abdominal pain, diarrhea and blood in stool.  Genitourinary: Negative for dysuria.  Musculoskeletal: Positive for back pain and falls (Multiple falls in the past several weeks).  Skin:       Toes are red left leg  Neurological: Positive for weakness. Negative for dizziness, tremors, speech change, focal weakness, seizures and headaches.  Endo/Heme/Allergies: Does not bruise/bleed easily.  Psychiatric/Behavioral: Positive for memory loss. Negative for depression, suicidal ideas and hallucinations.    MEDICATIONS AT HOME:   Prior to Admission medications   Medication Sig Start Date End Date Taking? Authorizing Provider  acetaminophen (TYLENOL) 325 MG tablet Take 650 mg by mouth at bedtime.   Yes Historical Provider, MD  amLODipine (NORVASC) 5 MG tablet Take 5 mg by mouth daily.   Yes Historical Provider,  MD  aspirin EC 81 MG tablet Take 81 mg by mouth daily.   Yes Historical Provider, MD  dorzolamide (TRUSOPT) 2 % ophthalmic solution Place 1 drop into both eyes 2 (two) times daily.   Yes Historical Provider, MD  escitalopram (LEXAPRO) 10 MG tablet Take 10 mg by mouth daily.   Yes Historical Provider, MD  lamoTRIgine (LAMICTAL) 25 MG tablet Take 25 mg by mouth daily.   Yes Historical  Provider, MD  latanoprost (XALATAN) 0.005 % ophthalmic solution Place 1 drop into both eyes at bedtime.   Yes Historical Provider, MD  memantine (NAMENDA) 10 MG tablet Take 10 mg by mouth 2 (two) times daily.   Yes Historical Provider, MD  Multiple Vitamin (MULTIVITAMIN WITH MINERALS) TABS tablet Take 1 tablet by mouth daily.   Yes Historical Provider, MD  neomycin-bacitracin-polymyxin (NEOSPORIN) ointment Apply 1 application topically daily. Apply to left 3rd toe daily until healed.   Yes Historical Provider, MD  omega-3 acid ethyl esters (LOVAZA) 1 g capsule Take 1 g by mouth daily.   Yes Historical Provider, MD  polyethylene glycol (MIRALAX / GLYCOLAX) packet Take 17 g by mouth daily as needed for mild constipation or moderate constipation.   Yes Historical Provider, MD  simvastatin (ZOCOR) 40 MG tablet Take 40 mg by mouth at bedtime.   Yes Historical Provider, MD  zinc sulfate 220 (50 Zn) MG capsule Take 220 mg by mouth 2 (two) times daily.   Yes Historical Provider, MD      VITAL SIGNS:  Blood pressure 162/71, pulse 76, temperature 98.2 F (36.8 C), resp. rate 19, height 6\' 4"  (1.93 m), weight 79.379 kg (175 lb), SpO2 98 %.  PHYSICAL EXAMINATION:   Physical Exam  Constitutional: He is oriented to person, place, and time and well-developed, well-nourished, and in no distress. No distress.  HENT:  Head: Normocephalic.  Eyes: No scleral icterus.  Neck: Normal range of motion. Neck supple. No JVD present. No tracheal deviation present.  Cardiovascular: Normal rate and regular rhythm.  Exam reveals no gallop and no friction rub.   Murmur heard. Pulmonary/Chest: Effort normal and breath sounds normal. No respiratory distress. He has no wheezes. He has no rales. He exhibits no tenderness.  Abdominal: Soft. Bowel sounds are normal. He exhibits no distension and no mass. There is no tenderness. There is no rebound and no guarding.  Musculoskeletal: Normal range of motion. He exhibits edema  (Left greater than right ). He exhibits no tenderness.  Neurological: He is alert and oriented to person, place, and time.  Skin: No rash noted.  Left foot is cold to touch with faint pulse audible by Doppler toes on left foot are red  Psychiatric: Mood, affect and judgment normal.      LABORATORY PANEL:   CBC  Recent Labs Lab 10/27/15 0925  WBC 15.6*  HGB 11.7*  HCT 34.8*  PLT 159   ------------------------------------------------------------------------------------------------------------------  Chemistries   Recent Labs Lab 10/27/15 0925  NA 139  K 4.9  CL 108  CO2 20*  GLUCOSE 119*  BUN 91*  CREATININE 4.41*  CALCIUM 8.9   ------------------------------------------------------------------------------------------------------------------  Cardiac Enzymes  Recent Labs Lab 10/27/15 0925  TROPONINI 0.06*   ------------------------------------------------------------------------------------------------------------------  RADIOLOGY:  Dg Chest 2 View  10/27/2015  CLINICAL DATA:  Worsening back pain. Shortness of breath. Leukocytosis. EXAM: CHEST  2 VIEW COMPARISON:  None, including search under other medical record numbers. FINDINGS: There is extensive opacification of right chest with pleural calcification, pleural thickening, and volume  loss. The left lung is hyperinflated but clear. No convincing left-sided pleural calcification right-sided opacity could all be chronic, but cannot exclude pneumonia, complex pleural effusion, or malignancy. Normal heart size and mediastinal contours. IMPRESSION: Extensive opacification of the right chest, constellation of findings suggesting fibrothorax. Cannot exclude superimposed acute or malignant pleural parenchymal disease and chest CT is suggested. Electronically Signed   By: Marnee Spring M.D.   On: 10/27/2015 10:36   Dg Lumbar Spine Complete  10/27/2015  CLINICAL DATA:  Worsening low back pain and shortness-of-breath  today. EXAM: LUMBAR SPINE - COMPLETE 4+ VIEW COMPARISON:  None. FINDINGS: Mild curvature of the lumbar spine convex to the right. Vertebral body heights are within normal. There is moderate spondylosis throughout the lumbar spine to include facet arthropathy. Moderate disc space narrowing at the L2-3, L3-4 L4-5 levels. Significant calcified plaque over the abdominal aorta and iliac arteries. IMPRESSION: Moderate spondylosis of the lumbar spine with significant disc disease from the L2-3 level to the L4-5 level. Mild curvature convex right. Electronically Signed   By: Elberta Fortis M.D.   On: 10/27/2015 10:46   US Venous Img Lower Unilateral Left  10/27/2015  CLINICAL DATA:  Left leg swelling. EXAM: LEFT LOWER EXTREMITY VENOUS DOPPLER ULTRASOUND TECHNIQUE: Gray-scale sonography with graded compression, as well as color Doppler and duplex ultrasound were performed to evaluate the lower extremity deep venous systems from the level of the common femoral vein and including the common femoral, femoral, profunda femoral, popliteal and calf veins including the posterior tibial, peroneal and gastrocnemius veins when visible. The superficial great saphenous vein was also interrogated. Spectral Doppler was utilized to evaluate flow at rest and with distal augmentation maneuvers in the common femoral, femoral and popliteal veins. COMPARISON:  None. FINDINGS: Contralateral Common Femoral Vein: Occlusive thrombus noted within the right common femoral vein. Common Femoral Vein: Occlusive thrombus noted. Saphenofemoral Junction: Occlusive thrombus noted. Profunda Femoral Vein: Occlusive thrombus noted. Femoral Vein: Occlusive thrombus noted. Popliteal Vein: A occlusive thrombus noted. Calf Veins: Occlusive thrombus noted. Superficial Great Saphenous Vein: Non a well visualized. Venous Reflux:  None. Other Findings:  None. IMPRESSION: Extensive occlusive thrombus throughout the left lower extremity venous system. Occlusive  thrombus is also noted in the contralateral right common femoral vein. These results will be called to the ordering clinician or representative by the Radiologist Assistant, and communication documented in the PACS or zVision Dashboard. Electronically Signed   By: Charlett Nose M.D.   On: 10/27/2015 10:16    EKG:   Sinus rhythm with atrial premature complex  IMPRESSION AND PLAN:   82 year old male with a history of dementia and hypertension who presents with lower extremity edema and weakness and found to have bilateral DVT and acute kidney injury.  1. Acute kidney injury, unknown baseline creatinine: Possibly related to prerenal azotemia from Lasix. Order bladder scan and renal ultrasound. Start V fluids and repeat creatinine in a.m. Hold nephrotoxic agents Consult nephrology for further evaluation and recommendations  2. Bilateral DVT with fall risk with extensive occlusive thrombus in left lower extremity: Patient is currently on heparin drip. Daughter was consented. Side effects, alternatives and risks were discussed with the daughter. Ultimately patient will need IVC filter fall risk. Consult vascular surgery.  3. Urinary tract infection: Follow up on urine culture and order Rocephin.  4. Left Lower extremity erythema concerning for vascular disease: Consult vascular surgery. Order echocardiogram to evaluate for thrombus.   5. Elevated troponin: This is likely due to demand ischemia and  poor renal clearance. Continue telemetry and follow troponins.  6. Dementia: Continue Namenda  7. Essential hypertension: Continue Norvasc. 8. Glaucoma: Continue eyedrops.  9. Depression: Continue Lexapro and Lamictal. 10. Back pain: No acute issues on x-ray. PT consult and pain control.  11. Abnormal chest x-ray with history of TB: Patient's daughter reports that this is a chronic finding on x-ray.  All the records are reviewed and case discussed with ED provider. Management plans  discussed with the patient and he in agreement Date of care also discussed with daughter at bedside. CODE STATUS: FULL  TOTAL TIME TAKING CARE OF THIS PATIENT: 50 minutes.    Merril Nagy M.D on 10/27/2015 at 11:56 AM  Between 7am to 6pm - Pager - [phone]  After 6pm go to [email or web address] - Social research officer, government  [facility]  Office  [phone]  CC: Primary care physician; No primary care provider on file.

## 2015-10-28 ENCOUNTER — Inpatient Hospital Stay: Payer: Medicare Other

## 2015-10-28 ENCOUNTER — Encounter: Payer: Self-pay | Admitting: Urology

## 2015-10-28 ENCOUNTER — Inpatient Hospital Stay (HOSPITAL_COMMUNITY)
Admit: 2015-10-28 | Discharge: 2015-10-28 | Disposition: A | Discharge status: Home / Self Care | Payer: Medicare Other | Attending: Internal Medicine | Admitting: Internal Medicine

## 2015-10-28 DIAGNOSIS — R310 Gross hematuria: Secondary | ICD-10-CM

## 2015-10-28 DIAGNOSIS — R338 Other retention of urine: Secondary | ICD-10-CM

## 2015-10-28 DIAGNOSIS — R011 Cardiac murmur, unspecified: Secondary | ICD-10-CM

## 2015-10-28 DIAGNOSIS — N3289 Other specified disorders of bladder: Secondary | ICD-10-CM

## 2015-10-28 LAB — HEPARIN LEVEL (UNFRACTIONATED)
HEPARIN UNFRACTIONATED: 0.5 [IU]/mL (ref 0.30–0.70)
HEPARIN UNFRACTIONATED: 0.39 [IU]/mL (ref 0.30–0.70)

## 2015-10-28 LAB — BASIC METABOLIC PANEL
Anion gap: 8 (ref 5–15)
BUN: 72 mg/dL — AB (ref 6–20)
CHLORIDE: 111 mmol/L (ref 101–111)
CO2: 19 mmol/L — ABNORMAL LOW (ref 22–32)
Calcium: 8.1 mg/dL — ABNORMAL LOW (ref 8.9–10.3)
Creatinine, Ser: 2.86 mg/dL — ABNORMAL HIGH (ref 0.61–1.24)
GFR, EST AFRICAN AMERICAN: 20 mL/min — AB (ref >60)
GFR, EST NON AFRICAN AMERICAN: 17 mL/min — AB (ref >60)
Glucose, Bld: 107 mg/dL — ABNORMAL HIGH (ref 65–99)
Potassium: 4.4 mmol/L (ref 3.5–5.1)
SODIUM: 138 mmol/L (ref 135–145)

## 2015-10-28 LAB — ECHOCARDIOGRAM COMPLETE
HEIGHTINCHES: 76 in
WEIGHTICAEL: 2800 [oz_av]

## 2015-10-28 LAB — CBC
HEMATOCRIT: 30.7 % — AB (ref 40.0–52.0)
Hemoglobin: 10.1 g/dL — ABNORMAL LOW (ref 13.0–18.0)
MCH: 28.5 pg (ref 26.0–34.0)
MCHC: 32.8 g/dL (ref 32.0–36.0)
MCV: 87 fL (ref 80.0–100.0)
Platelets: 153 10*3/uL (ref 150–440)
RBC: 3.53 MIL/uL — AB (ref 4.40–5.90)
RDW: 14.4 % (ref 11.5–14.5)
WBC: 16.1 10*3/uL — AB (ref 3.8–10.6)

## 2015-10-28 LAB — TROPONIN I
TROPONIN I: 3.19 ng/mL — AB (ref <0.031)
TROPONIN I: 2.82 ng/mL — AB (ref <0.031)

## 2015-10-28 MED ORDER — ATORVASTATIN CALCIUM 20 MG PO TABS: 20.0000 mg | ORAL_TABLET | Freq: Every day | ORAL | Status: DC

## 2015-10-28 MED ORDER — LORAZEPAM BOLUS VIA INFUSION: 0.2500 mg | Freq: Every evening | INTRAVENOUS | Status: DC | PRN

## 2015-10-28 MED ORDER — METOPROLOL TARTRATE 25 MG PO TABS
12.5000 mg | ORAL_TABLET | Freq: Two times a day (BID) | ORAL | Status: DC
  Administered 2015-10-28 – 2015-11-02 (×10): 12.5 mg via ORAL
  Filled 2015-10-28 (×10): qty 1

## 2015-10-28 MED ORDER — LORAZEPAM 2 MG/ML IJ SOLN
0.2500 mg | Freq: Every evening | INTRAMUSCULAR | Status: DC | PRN
  Administered 2015-10-28: 0.25 mg via INTRAVENOUS
  Filled 2015-10-28: qty 1

## 2015-10-28 NOTE — Evaluation (Signed)
Physical Therapy Evaluation Patient Details Name: Maurice Carney MRN: [id number] DOB: June 18, 1916 Today's Date: 10/28/2015   History of Present Illness  Pt is a 82 y/o male here with renal failure along with L LE DVT.  He has Alzheimer's, hypertension and chronic low back and knee pain.   Clinical Impression  Pt shows good effort with all requested acts but does not do nearly as well as he or his family expected.  Pt loosing balance in sitting and standing and is simply unable to make adjustments despite much cuing/assist.  Pt unable to shift weight forward onto rollator to even stand with anything less than max assist.  Pt pleasant but unsafe and impulsive with mobility. Apparently pt was up with hallucinations all night and may have been even more confused than normal during PT exam (though he was able to nap earlier in the day).     Follow Up Recommendations SNF    Equipment Recommendations  Rolling walker with 5" wheels (4 wheeled walker is not stable enough at this time)    Recommendations for Other Services       Precautions / Restrictions Precautions Precautions: Fall Restrictions Weight Bearing Restrictions: No      Mobility  Bed Mobility Overal bed mobility: Needs Assistance Bed Mobility: Supine to Sit     Supine to sit: Mod assist     General bed mobility comments: Pt shows good effort trying to get up w/o assist but ultiamtely needs heavy UE use and assist from PT to get to EOB.  Pt loosing balance back and to the R in sitting.   Transfers Overall transfer level: Needs assistance Equipment used: 4-wheeled walker Transfers: Sit to/from Stand Sit to Stand: Mod assist;Max assist         General transfer comment: Pt again shows good effort but is weak, confused and unable to get weight forward enough to maintain balance.  Needing considerable direct assist to remain upright.  Ambulation/Gait Ambulation/Gait assistance: Total assist Ambulation Distance (Feet): 1  Feet Assistive device: 4-wheeled walker       General Gait Details: Pt shows some effort in shuffling feet, but simply is unable to appropriate get weight onto walker and leans back heavily while impulsively trying to swing himself over to the recliner.  Pt completely unsafe and ultimately unable to meaningfully assist with what became form of stand-pivot transfer.   Stairs            Wheelchair Mobility    Modified Rankin (Stroke Patients Only)       Balance Overall balance assessment: Needs assistance   Sitting balance-Leahy Scale: Poor       Standing balance-Leahy Scale: Zero                               Pertinent Vitals/Pain Pain Assessment: No/denies pain (reports some general chronic knee/back pain)    Home Living Family/patient expects to be discharged to:: Assisted living               Home Equipment: Walker - 4 wheels Additional Comments: Daughter (who is a Engineer, civil (consulting) and highly involved with pt's care) agrees that he may need a higher level of care than they typically can give    Prior Function Level of Independence: Independent with assistive device(s)         Comments: apparently pt is able to walk and stay relatively active at his ALF despite some confusion, lives  there with his wife.     Hand Dominance        Extremity/Trunk Assessment   Upper Extremity Assessment: Generalized weakness (age appropriate limitations, unable to fully open R hand)           Lower Extremity Assessment: Generalized weakness (R LE grossly 4-/5, L 3+/5)         Communication   Communication: No difficulties  Cognition Arousal/Alertness: Awake/alert Behavior During Therapy: Impulsive Overall Cognitive Status: History of cognitive impairments - at baseline                      General Comments      Exercises        Assessment/Plan    PT Assessment Patient needs continued PT services  PT Diagnosis Difficulty  walking;Generalized weakness   PT Problem List Decreased strength;Decreased range of motion;Decreased balance;Decreased activity tolerance;Decreased mobility;Decreased coordination;Decreased cognition;Decreased knowledge of use of DME;Decreased safety awareness  PT Treatment Interventions DME instruction;Gait training;Stair training;Functional mobility training;Therapeutic activities;Therapeutic exercise;Balance training;Cognitive remediation;Patient/family education   PT Goals (Current goals can be found in the Care Plan section) Acute Rehab PT Goals Patient Stated Goal: family would like him back at the ALF  PT Goal Formulation: With family Time For Goal Achievement: 11/11/15 Potential to Achieve Goals: Fair    Frequency Min 2X/week   Barriers to discharge        Co-evaluation               End of Session Equipment Utilized During Treatment: Gait belt Activity Tolerance: Patient limited by fatigue Patient left: in chair;with call bell/phone within reach;with family/visitor present (family declines alarm, states they will be here 24/7) Nurse Communication: Mobility status         Time: [id number] PT Time Calculation (min) (ACUTE ONLY): 22 min   Charges:   PT Evaluation $PT Eval Moderate Complexity: 1 Procedure     PT G CodesMalachi Pro:        Javian Nudd R Zenola Dezarn, DPT   10/28/2015, 5:23 PM

## 2015-10-28 NOTE — Progress Notes (Signed)
ANTICOAGULATION CONSULT NOTE   Pharmacy Consult for Heparin Drip Indication: VTE prophylaxis  Allergies  Allergen Reactions  . Sulfa Antibiotics     Patient Measurements: Height: 6\' 4"  (193 cm) Weight: 175 lb (79.379 kg) IBW/kg (Calculated) : 86.8 Heparin Dosing Weight: 79 kg  Vital Signs: Temp: 99.2 F (37.3 C) (05/27 1139) Temp Source: Axillary (05/27 1139) BP: 113/47 mmHg (05/27 1139) Pulse Rate: 86 (05/27 1139)  Labs:  Recent Labs  10/27/15 0925 10/27/15 1618 10/27/15 1830 10/27/15 2300 10/28/15 0423 10/28/15 1151  HGB 11.7* 11.5* 12.3*  --  10.1*  --   HCT 34.8*  --   --   --  30.7*  --   PLT 159 152  --   --  153  --   APTT  --  >160*  --   --   --   --   LABPROT  --  17.4*  --   --   --   --   INR  --  1.42  --   --   --   --   HEPARINUNFRC  --   --  0.95*  --  0.50 0.39  CREATININE 4.41*  --   --   --  2.86*  --   TROPONINI 0.06* 0.08*  --  0.69*  --   --     Estimated Creatinine Clearance: 16.6 mL/min (by C-G formula based on Cr of 2.86).   Medical History: Past Medical History  Diagnosis Date  . Hypertension   . Hyperlipidemia   . Alzheimer disease   . MDD (major depressive disorder) (HCC)   . CKD (chronic kidney disease)     Medications:  Scheduled:  . acetaminophen  650 mg Oral QHS  . amLODipine  5 mg Oral Daily  . atorvastatin  20 mg Oral q1800  . cefTRIAXone (ROCEPHIN)  IV  1 g Intravenous Q24H  . dorzolamide  1 drop Both Eyes BID  . escitalopram  10 mg Oral Daily  . lamoTRIgine  25 mg Oral QHS  . latanoprost  1 drop Both Eyes QHS  . memantine  10 mg Oral BID  . multivitamin with minerals  1 tablet Oral Daily  . omega-3 acid ethyl esters  1 g Oral Daily  . sodium chloride flush  3 mL Intravenous Q12H  . tamsulosin  0.4 mg Oral Daily  . zinc sulfate  220 mg Oral BID   Infusions:  . sodium chloride 75 mL/hr at 10/27/15 1236  . heparin 1,200 Units/hr (10/28/15 [id number])    Assessment: 82 yo male with bilateral DVT's ordered  heparin drip for possible PE's. Patient receiving heparin 1350units/hr.   Goal of Therapy:  Heparin level 0.3-0.7 units/ml    Plan:  2nd heparin level was therapeutic as well. Will continue current rate of heparin gtt. Will recheck heparin level and cbc in am.   Pharmacy will continue to monitor and adjust per consult.   Nhyla Nappi D, Pharm.D., BCPS Clinical Pharmacist  10/28/2015,12:19 PM

## 2015-10-28 NOTE — Progress Notes (Signed)
Called by nurse with repeat troponin results. Troponin 3.19. Still suspect this is demand ischemia from infection, extreme urinary retention and possibility of PE. Patient is already on heparin, will continue. Will add B-blocker. Will hold off on aspirin as this may aggrevate the hematuria. Discussed plan with cardiology and they are in agreement. Dr Duke Salviaandolph will see patient in the am. Discussed with son-in -law who was still at hospital to update family.

## 2015-10-28 NOTE — Progress Notes (Signed)
MD Desiree HaneVachani aware of troponin level of 0.69, no new orders received, will continue to monitor pt.

## 2015-10-28 NOTE — Consult Note (Signed)
[facility] CONSULT NOTE    Date: 10/28/2015                  Patient Name:  Maurice SchmidtHarold Baxley  MRN: [id number]  DOB: 01-26-1917  Age / Sex: 82 y.o., male         PCP: No primary care provider on file.                 Service Requesting Consult: Hospitalist, Dr. Juliene PinaMody                 Reason for Consult: Acute renal failure, CKD stage III            History of Present Illness: Patient is a 82 y.o. male with a PMHx of Hypertension, hyperlipidemia, Alzheimer's disease, depression, chronic kidney disease stage III, who was admitted to [facility] on 10/27/2015 for evaluation of bilateral lower extremity edema left greater than right.  Patient unable to provide significant history. History is provided by the patient's daughter who is a Engineer, civil (consulting)nurse. Patient and his [place] moved from [place] to [place] recently. They are residing in an assisted care facility. Recently he's had increasing lower extremity edema. For this he was recently started on Lasix. Unfortunately the edema progressed.  He had a left lower extremity venous duplex performed which demonstrated extensive thrombus throughout the left lower extremity venous system. There was also occlusive thrombus in the contralateral right common femoral vein. He has been started on anticoagulation. The patient had Foley catheter placement however this was apparently in part traumatic.  He apparently has prior history of urethral stricture. A significant amount of urine output was noted immediately after Foley catheter placement therefore he had some urinary retention as well. He now has gross hematuria and the patient is currently on heparin drip for his underlying new right and left lower extremity DVT. The patient's baseline creatinine is 1.5 per the daughter's report. Presenting creatinine was 4.4 and is now down to 2.86 after Foley catheter placement.  Renal ultrasound was performed and was negative for obstruction however there was a  complicated right renal cyst.   Medications: Outpatient medications: Prescriptions prior to admission  Medication Sig Dispense Refill Last Dose  . acetaminophen (TYLENOL) 325 MG tablet Take 650 mg by mouth at bedtime.   10/26/2015 at 2100  . amLODipine (NORVASC) 5 MG tablet Take 5 mg by mouth daily.   10/26/2015 at es  . aspirin EC 81 MG tablet Take 81 mg by mouth daily.   10/26/2015 at 0800  . dorzolamide (TRUSOPT) 2 % ophthalmic solution Place 1 drop into both eyes 2 (two) times daily.   10/26/2015 at 2100  . escitalopram (LEXAPRO) 10 MG tablet Take 10 mg by mouth daily.   10/26/2015 at 0900  . lamoTRIgine (LAMICTAL) 25 MG tablet Take 25 mg by mouth daily.   10/26/2015 at 2100  . latanoprost (XALATAN) 0.005 % ophthalmic solution Place 1 drop into both eyes at bedtime.   10/26/2015 at 2100  . memantine (NAMENDA) 10 MG tablet Take 10 mg by mouth 2 (two) times daily.   10/26/2015 at 2100  . Multiple Vitamin (MULTIVITAMIN WITH MINERALS) TABS tablet Take 1 tablet by mouth daily.   10/26/2015 at 0900  . neomycin-bacitracin-polymyxin (NEOSPORIN) ointment Apply 1 application topically daily. Apply to left 3rd toe daily until healed.   10/26/2015 at Unknown time  . omega-3 acid ethyl esters (LOVAZA) 1 g capsule Take 1 g by mouth daily.   10/26/2015 at  0900  . polyethylene glycol (MIRALAX / GLYCOLAX) packet Take 17 g by mouth daily as needed for mild constipation or moderate constipation.   10/12/2015 at 1129  . simvastatin (ZOCOR) 40 MG tablet Take 40 mg by mouth at bedtime.   10/26/2015 at 2100  . zinc sulfate 220 (50 Zn) MG capsule Take 220 mg by mouth 2 (two) times daily.   10/26/2015 at 2100    Current medications: Current Facility-Administered Medications  Medication Dose Route Frequency Provider Last Rate Last Dose  . 0.9 %  sodium chloride infusion   Intravenous Continuous Adrian Saran, MD 75 mL/hr at 10/27/15 1236    . acetaminophen (TYLENOL) tablet 650 mg  650 mg Oral Q6H PRN Adrian Saran, MD       Or   . acetaminophen (TYLENOL) suppository 650 mg  650 mg Rectal Q6H PRN Adrian Saran, MD      . acetaminophen (TYLENOL) tablet 650 mg  650 mg Oral QHS Adrian Saran, MD   650 mg at 10/27/15 2248  . amLODipine (NORVASC) tablet 5 mg  5 mg Oral Daily Adrian Saran, MD   5 mg at 10/28/15 1000  . atorvastatin (LIPITOR) tablet 20 mg  20 mg Oral q1800 Gracelyn Nurse, MD      . cefTRIAXone (ROCEPHIN) 1 g in dextrose 5 % 50 mL IVPB  1 g Intravenous Q24H Cindi Carbon, RPH 100 mL/hr at 10/28/15 1215 1 g at 10/28/15 1215  . dorzolamide (TRUSOPT) 2 % ophthalmic solution 1 drop  1 drop Both Eyes BID Adrian Saran, MD   1 drop at 10/28/15 1000  . heparin ADULT infusion 100 units/mL (25000 units/248mL sodium chloride 0.45%)  1,200 Units/hr Intravenous Continuous Adrian Saran, MD 12 mL/hr at 10/28/15 0632 1,200 Units/hr at 10/28/15 1610  . lamoTRIgine (LAMICTAL) tablet 25 mg  25 mg Oral QHS Adrian Saran, MD   25 mg at 10/27/15 2248  . latanoprost (XALATAN) 0.005 % ophthalmic solution 1 drop  1 drop Both Eyes QHS Adrian Saran, MD   1 drop at 10/27/15 2247  . LORazepam (ATIVAN) injection 0.25 mg  0.25 mg Intravenous QHS,MR X 1 Gracelyn Nurse, MD      . memantine [facility]) tablet 10 mg  10 mg Oral BID Adrian Saran, MD   10 mg at 10/28/15 1000  . multivitamin with minerals tablet 1 tablet  1 tablet Oral Daily Adrian Saran, MD   1 tablet at 10/28/15 0931  . omega-3 acid ethyl esters (LOVAZA) capsule 1 g  1 g Oral Daily Adrian Saran, MD   1 g at 10/28/15 0931  . ondansetron (ZOFRAN) tablet 4 mg  4 mg Oral Q6H PRN Adrian Saran, MD       Or  . ondansetron (ZOFRAN) injection 4 mg  4 mg Intravenous Q6H PRN Sital Mody, MD      . polyethylene glycol (MIRALAX / GLYCOLAX) packet 17 g  17 g Oral Daily PRN Adrian Saran, MD      . senna-docusate (Senokot-S) tablet 1 tablet  1 tablet Oral QHS PRN Adrian Saran, MD      . sodium chloride flush (NS) 0.9 % injection 3 mL  3 mL Intravenous Q12H Sital Mody, MD   3 mL at 10/28/15 1000  . tamsulosin (FLOMAX) capsule  0.4 mg  0.4 mg Oral Daily Sital Mody, MD   0.4 mg at 10/28/15 1000  . zinc sulfate capsule 220 mg  220 mg Oral BID Adrian Saran, MD   220 mg  at 10/28/15 0931      Allergies: Allergies  Allergen Reactions  . Sulfa Antibiotics       Past Medical History: Past Medical History  Diagnosis Date  . Hypertension   . Hyperlipidemia   . Alzheimer disease   . MDD (major depressive disorder) (HCC)   . CKD (chronic kidney disease)      Past Surgical History: Prior history of urethral stricture s/p intervention  Family History: No family history of ESRD  Social History: Married, recently moved here from [place], lives in an assisted living facility, no current tobacco or ETOH use.    Review of Systems: {atient unable to provide ROS due to dementia.   Vital Signs: Blood pressure 113/47, pulse 86, temperature 99.2 F (37.3 C), temperature source Axillary, resp. rate 18, height 6\' 4"  (1.93 m), weight 79.379 kg (175 lb), SpO2 93 %.  Weight trends: Filed Weights   10/27/15 1101  Weight: 79.379 kg (175 lb)    Physical Exam: General: NAD, resting in bed  Head: Normocephalic, atraumatic.  Eyes: Anicteric, EOMI  Nose: Mucous membranes moist, not inflammed, nonerythematous.  Throat: Oropharynx nonerythematous, no exudate appreciated.   Neck: Supple, trachea midline.  Lungs:  Normal respiratory effort. Clear to auscultation BL without crackles or wheezes.  Heart: RRR. S1 and S2 no rubs  Abdomen:  BS normoactive. Soft, Nondistended, non-tender.  No masses or organomegaly.  Extremities: Left greater than right lower extremity edema  Neurologic: Awake, alert, follows simple commands, Motor strength is 5/5 in the all 4 extremities  Skin: No visible rashes, scars.    Lab results: Basic Metabolic Panel:  Recent Labs Lab 10/27/15 0925 10/28/15 0423  NA 139 138  K 4.9 4.4  CL 108 111  CO2 20* 19*  GLUCOSE 119* 107*  BUN 91* 72*  CREATININE 4.41* 2.86*  CALCIUM 8.9 8.1*     Liver Function Tests: No results for input(s): AST, ALT, ALKPHOS, BILITOT, PROT, ALBUMIN in the last 168 hours. No results for input(s): LIPASE, AMYLASE in the last 168 hours. No results for input(s): AMMONIA in the last 168 hours.  CBC:  Recent Labs Lab 10/27/15 0925 10/27/15 1618 10/27/15 1830 10/28/15 0423  WBC 15.6*  --   --  16.1*  NEUTROABS 13.0*  --   --   --   HGB 11.7* 11.5* 12.3* 10.1*  HCT 34.8*  --   --  30.7*  MCV 86.4  --   --  87.0  PLT 159 152  --  153    Cardiac Enzymes:  Recent Labs Lab 10/27/15 0925 10/27/15 1618 10/27/15 2300  TROPONINI 0.06* 0.08* 0.69*    BNP: Invalid input(s): POCBNP  CBG: No results for input(s): GLUCAP in the last 168 hours.  Microbiology: Results for orders placed or performed during the hospital encounter of 10/27/15  Blood culture (routine x 2)     Status: None (Preliminary result)   Collection Time: 10/27/15  9:25 AM  Result Value Ref Range Status   Specimen Description BLOOD LEFT FOREARM  Final   Special Requests BOTTLES DRAWN AEROBIC AND ANAEROBIC  10CC  Final   Culture NO GROWTH < 24 HOURS  Final   Report Status PENDING  Incomplete  Blood culture (routine x 2)     Status: None (Preliminary result)   Collection Time: 10/27/15  9:25 AM  Result Value Ref Range Status   Specimen Description BLOOD LEFT ASSIST CONTROL  Final   Special Requests BOTTLES DRAWN AEROBIC AND ANAEROBIC  7CC  Final   Culture NO GROWTH < 24 HOURS  Final   Report Status PENDING  Incomplete  MRSA PCR Screening     Status: None   Collection Time: 10/27/15  5:14 PM  Result Value Ref Range Status   MRSA by PCR NEGATIVE NEGATIVE Final    Comment:        The GeneXpert MRSA Assay (FDA approved for NASAL specimens only), is one component of a comprehensive MRSA colonization surveillance program. It is not intended to diagnose MRSA infection nor to guide or monitor treatment for MRSA infections.     Coagulation Studies:  Recent  Labs  10/27/15 1618  LABPROT 17.4*  INR 1.42    Urinalysis:  Recent Labs  10/27/15 0925  COLORURINE YELLOW*  LABSPEC 1.012  PHURINE 5.0  GLUCOSEU NEGATIVE  HGBUR 2+*  BILIRUBINUR NEGATIVE  KETONESUR NEGATIVE  PROTEINUR NEGATIVE  NITRITE NEGATIVE  LEUKOCYTESUR 3+*      Imaging: Dg Chest 2 View  10/27/2015  CLINICAL DATA:  Worsening back pain. Shortness of breath. Leukocytosis. EXAM: CHEST  2 VIEW COMPARISON:  None, including search under other medical record numbers. FINDINGS: There is extensive opacification of right chest with pleural calcification, pleural thickening, and volume loss. The left lung is hyperinflated but clear. No convincing left-sided pleural calcification right-sided opacity could all be chronic, but cannot exclude pneumonia, complex pleural effusion, or malignancy. Normal heart size and mediastinal contours. IMPRESSION: Extensive opacification of the right chest, constellation of findings suggesting fibrothorax. Cannot exclude superimposed acute or malignant pleural parenchymal disease and chest CT is suggested. Electronically Signed   By: Marnee Spring M.D.   On: 10/27/2015 10:36   Dg Lumbar Spine Complete  10/27/2015  CLINICAL DATA:  Worsening low back pain and shortness-of-breath today. EXAM: LUMBAR SPINE - COMPLETE 4+ VIEW COMPARISON:  None. FINDINGS: Mild curvature of the lumbar spine convex to the right. Vertebral body heights are within normal. There is moderate spondylosis throughout the lumbar spine to include facet arthropathy. Moderate disc space narrowing at the L2-3, L3-4 L4-5 levels. Significant calcified plaque over the abdominal aorta and iliac arteries. IMPRESSION: Moderate spondylosis of the lumbar spine with significant disc disease from the L2-3 level to the L4-5 level. Mild curvature convex right. Electronically Signed   By: Elberta Fortis M.D.   On: 10/27/2015 10:46   US Renal  10/28/2015  CLINICAL DATA:  Renal failure EXAM: RENAL / URINARY  TRACT ULTRASOUND COMPLETE COMPARISON:  None. FINDINGS: Right Kidney: Length: 12.4 cm. Echogenicity within normal limits. No hydronephrosis visualized. 5.4 x 4 x 5.3 cm anechoic right renal mass consistent with a cyst. 4.6 x 4.5 x 4.4 cm anechoic right renal mass with thin septations most consistent with a mildly complicated cyst. Left Kidney: Length: 11.7 cm. Echogenicity within normal limits. No hydronephrosis visualized. Two anechoic left renal masses measuring 2.5 x 3 x 2.7 cm and 4.5 x 4.3 x 5.1 cm respectively. Bladder: Bladder wall thickening likely secondary to underdistention with a Foley catheter present. IMPRESSION: 1. No obstructive uropathy. 2. Multiple bilateral renal cysts. 3. Mildly complicated right renal cyst with thin internal septations. Electronically Signed   By: Elige Ko   On: 10/28/2015 12:53   US Venous Img Lower Unilateral Left  10/27/2015  CLINICAL DATA:  Left leg swelling. EXAM: LEFT LOWER EXTREMITY VENOUS DOPPLER ULTRASOUND TECHNIQUE: Gray-scale sonography with graded compression, as well as color Doppler and duplex ultrasound were performed to evaluate the lower extremity deep venous systems from the level  of the common femoral vein and including the common femoral, femoral, profunda femoral, popliteal and calf veins including the posterior tibial, peroneal and gastrocnemius veins when visible. The superficial great saphenous vein was also interrogated. Spectral Doppler was utilized to evaluate flow at rest and with distal augmentation maneuvers in the common femoral, femoral and popliteal veins. COMPARISON:  None. FINDINGS: Contralateral Common Femoral Vein: Occlusive thrombus noted within the right common femoral vein. Common Femoral Vein: Occlusive thrombus noted. Saphenofemoral Junction: Occlusive thrombus noted. Profunda Femoral Vein: Occlusive thrombus noted. Femoral Vein: Occlusive thrombus noted. Popliteal Vein: A occlusive thrombus noted. Calf Veins: Occlusive thrombus  noted. Superficial Great Saphenous Vein: Non a well visualized. Venous Reflux:  None. Other Findings:  None. IMPRESSION: Extensive occlusive thrombus throughout the left lower extremity venous system. Occlusive thrombus is also noted in the contralateral right common femoral vein. These results will be called to the ordering clinician or representative by the Radiologist Assistant, and communication documented in the PACS or zVision Dashboard. Electronically Signed   By: Charlett Nose M.D.   On: 10/27/2015 10:16      Assessment & Plan: Pt is a 82 y.o. male with a PMHx of Hypertension, hyperlipidemia, Alzheimer's disease, depression, chronic kidney disease stage III, who was admitted to [facility] on 10/27/2015 for evaluation of bilateral lower extremity edema left greater than right, found to have acute DVT.   1.  Acute renal failure/CKD stage III. Baseline creatinine reported to [place] as being 1.5. Presenting creatinine was above 4 and is now down to 2.8. Acute renal failure likely related to urinary retention as well as recent furosemide use. Continue IV fluid hydration. No acute indication for dialysis. We will consult with urology given the urinary retention and additional urologic issues.  2.  Urinary retention with subsequent gross hematuria/history of uretheral stricture/Complicated right renal cyst.  Patient with difficult Foley placement. He apparently has history of urethral stricture. He now has gross hematuria. Incidentally he was also found to have a complicated right renal cyst with thin septations. Given the number of urologic issues we will obtain urology consultation.  3. Anemia of chronic kidney disease. Hemoglobin has come down a bit to 10.1. Drop could be related to gross hematuria. Continue to monitor CBC closely.  4. Thanks for consultation.

## 2015-10-28 NOTE — Evaluation (Signed)
Clinical/Bedside Swallow Evaluation Patient Details  Name: Maurice Carney MRN: [id number] Date of Birth: 01-Jan-1917  Today's Date: 10/28/2015 Time: SLP Start Time (ACUTE ONLY): 0911 SLP Stop Time (ACUTE ONLY): 0939 SLP Time Calculation (min) (ACUTE ONLY): 28 min  Past Medical History:  Past Medical History  Diagnosis Date  . Hypertension   . Hyperlipidemia   . Alzheimer disease   . MDD (major depressive disorder) (HCC)   . CKD (chronic kidney disease)    Past Surgical History: History reviewed. No pertinent past surgical history. HPI:      Assessment / Plan / Recommendation Clinical Impression  pt presented with no overt ssx aspiraiton during this assessment. Family reports that pt has been coughing and having increased difficulty at meals but appears to only be at hight. family reports that OT at ALF where pt resides has noticed possbile difficulty with meals at hight. ST educated on fatigue and to limit intake when tired and limit straws as pt intakes too buch liquid with straws. pt and family gave verbal understandign and agreement. SLP to follow up x 1 to assess if pt has any further difficulties.     Aspiration Risk  No limitations    Diet Recommendation Regular;Thin liquid   Liquid Administration via: Cup;No straw Medication Administration: Whole meds with liquid Supervision: Patient able to self feed Compensations: Minimize environmental distractions;Slow rate;Small sips/bites;Follow solids with liquid Postural Changes: Seated upright at 90 degrees    Other  Recommendations Oral Care Recommendations: Patient independent with oral care   Follow up Recommendations    Reg diet thin liquids follow up with slp x1 week    Frequency and Duration min 1 x/week  1 week       Prognosis Prognosis for Safe Diet Advancement: Good      Swallow Study   General Date of Onset: 10/27/15 Type of Study: Bedside Swallow Evaluation Diet Prior to this Study: Regular;Thin  liquids Temperature Spikes Noted: No Respiratory Status: Room air History of Recent Intubation: No Behavior/Cognition: Alert;Cooperative;Pleasant mood Oral Cavity Assessment: Within Functional Limits Oral Care Completed by SLP: No Oral Cavity - Dentition: Adequate natural dentition Vision: Functional for self-feeding Self-Feeding Abilities: Able to feed self Patient Positioning: Upright in bed Baseline Vocal Quality: Normal Volitional Cough: Strong Volitional Swallow: Able to elicit    Oral/Motor/Sensory Function Overall Oral Motor/Sensory Function: Within functional limits   Ice Chips Ice chips: Within functional limits Presentation: Self Fed;Spoon   Thin Liquid Presentation: Cup;Straw;Self Fed    Nectar Thick Nectar Thick Liquid: Not tested   Honey Thick Honey Thick Liquid: Not tested   Puree Puree: Within functional limits Presentation: Self Fed;Spoon   Solid   GO   Solid: Within functional limits Presentation: Self Fed;Spoon        Meredith PelStacie Harris Sauber 10/28/2015,12:15 PM

## 2015-10-28 NOTE — Progress Notes (Addendum)
Foley. NSR. Room air. Heparin at 12. NS @75 . Takes meds whole with water 1-1. Family at the bedside. Pt has no further concerns at this time.

## 2015-10-28 NOTE — Progress Notes (Addendum)
Subjective: Patient admitted with ARF and urinary retention. C/O left leg swelling.  Objective: Vital signs in last 24 hours: Temp:  [98 F (36.7 C)-99.2 F (37.3 C)] 99.2 F (37.3 C) (05/27 1139) Pulse Rate:  [86-96] 86 (05/27 1139) Resp:  [16-18] 18 (05/27 1139) BP: (113-174)/(47-69) 113/47 mmHg (05/27 1139) SpO2:  [89 %-94 %] 93 % (05/27 1139) Weight change:  Last BM Date: 10/27/15  Intake/Output from previous day: 05/26 0701 - 05/27 0700 In: 593.2 [I.V.:543.2; IV Piggyback:50] Out: 7050 [Urine:7050] Intake/Output this shift: Total I/O In: 480 [P.O.:480] Out: -   General appearance: alert, cooperative and no distress Head: Normocephalic, without obvious abnormality, atraumatic Eyes: conjunctivae/corneas clear. PERRL, EOM's intact. Fundi benign. Ears: normal Neck: no JVD and supple, symmetrical, trachea midline Resp: clear to auscultation bilaterally and normal percussion bilaterally Cardio: regular rate and rhythm GI: soft, non-tender; bowel sounds normal; no masses,  no organomegaly Extremities: Left lower ext edema Skin: Skin color, texture, turgor normal. No rashes or lesions Neurologic: Alert and oriented X 3, normal strength and tone. Normal symmetric reflexes. Normal coordination and gait  Lab Results:  Recent Labs  10/27/15 0925 10/27/15 1618 10/27/15 1830 10/28/15 0423  WBC 15.6*  --   --  16.1*  HGB 11.7* 11.5* 12.3* 10.1*  HCT 34.8*  --   --  30.7*  PLT 159 152  --  153   BMET  Recent Labs  10/27/15 0925 10/28/15 0423  NA 139 138  K 4.9 4.4  CL 108 111  CO2 20* 19*  GLUCOSE 119* 107*  BUN 91* 72*  CREATININE 4.41* 2.86*  CALCIUM 8.9 8.1*    Studies/Results: Dg Chest 2 View  10/27/2015  CLINICAL DATA:  Worsening back pain. Shortness of breath. Leukocytosis. EXAM: CHEST  2 VIEW COMPARISON:  None, including search under other medical record numbers. FINDINGS: There is extensive opacification of right chest with pleural calcification,  pleural thickening, and volume loss. The left lung is hyperinflated but clear. No convincing left-sided pleural calcification right-sided opacity could all be chronic, but cannot exclude pneumonia, complex pleural effusion, or malignancy. Normal heart size and mediastinal contours. IMPRESSION: Extensive opacification of the right chest, constellation of findings suggesting fibrothorax. Cannot exclude superimposed acute or malignant pleural parenchymal disease and chest CT is suggested. Electronically Signed   By: Marnee Spring M.D.   On: 10/27/2015 10:36   Dg Lumbar Spine Complete  10/27/2015  CLINICAL DATA:  Worsening low back pain and shortness-of-breath today. EXAM: LUMBAR SPINE - COMPLETE 4+ VIEW COMPARISON:  None. FINDINGS: Mild curvature of the lumbar spine convex to the right. Vertebral body heights are within normal. There is moderate spondylosis throughout the lumbar spine to include facet arthropathy. Moderate disc space narrowing at the L2-3, L3-4 L4-5 levels. Significant calcified plaque over the abdominal aorta and iliac arteries. IMPRESSION: Moderate spondylosis of the lumbar spine with significant disc disease from the L2-3 level to the L4-5 level. Mild curvature convex right. Electronically Signed   By: Elberta Fortis M.D.   On: 10/27/2015 10:46   US Renal  10/28/2015  CLINICAL DATA:  Renal failure EXAM: RENAL / URINARY TRACT ULTRASOUND COMPLETE COMPARISON:  None. FINDINGS: Right Kidney: Length: 12.4 cm. Echogenicity within normal limits. No hydronephrosis visualized. 5.4 x 4 x 5.3 cm anechoic right renal mass consistent with a cyst. 4.6 x 4.5 x 4.4 cm anechoic right renal mass with thin septations most consistent with a mildly complicated cyst. Left Kidney: Length: 11.7 cm. Echogenicity within normal limits. No  hydronephrosis visualized. Two anechoic left renal masses measuring 2.5 x 3 x 2.7 cm and 4.5 x 4.3 x 5.1 cm respectively. Bladder: Bladder wall thickening likely secondary to  underdistention with a Foley catheter present. IMPRESSION: 1. No obstructive uropathy. 2. Multiple bilateral renal cysts. 3. Mildly complicated right renal cyst with thin internal septations. Electronically Signed   By: Elige KoHetal  Patel   On: 10/28/2015 12:53   [place] Venous Img Lower Unilateral Left  10/27/2015  CLINICAL DATA:  Left leg swelling. EXAM: LEFT LOWER EXTREMITY VENOUS DOPPLER ULTRASOUND TECHNIQUE: Gray-scale sonography with graded compression, as well as color Doppler and duplex ultrasound were performed to evaluate the lower extremity deep venous systems from the level of the common femoral vein and including the common femoral, femoral, profunda femoral, popliteal and calf veins including the posterior tibial, peroneal and gastrocnemius veins when visible. The superficial great saphenous vein was also interrogated. Spectral Doppler was utilized to evaluate flow at rest and with distal augmentation maneuvers in the common femoral, femoral and popliteal veins. COMPARISON:  None. FINDINGS: Contralateral Common Femoral Vein: Occlusive thrombus noted within the right common femoral vein. Common Femoral Vein: Occlusive thrombus noted. Saphenofemoral Junction: Occlusive thrombus noted. Profunda Femoral Vein: Occlusive thrombus noted. Femoral Vein: Occlusive thrombus noted. Popliteal Vein: A occlusive thrombus noted. Calf Veins: Occlusive thrombus noted. Superficial Great Saphenous Vein: Non a well visualized. Venous Reflux:  None. Other Findings:  None. IMPRESSION: Extensive occlusive thrombus throughout the left lower extremity venous system. Occlusive thrombus is also noted in the contralateral right common femoral vein. These results will be called to the ordering clinician or representative by the Radiologist Assistant, and communication documented in the PACS or zVision Dashboard. Electronically Signed   By: Charlett NoseKevin  Dover M.D.   On: 10/27/2015 10:16    Medications:  Scheduled: . acetaminophen  650 mg  Oral QHS  . amLODipine  5 mg Oral Daily  . cefTRIAXone (ROCEPHIN)  IV  1 g Intravenous Q24H  . dorzolamide  1 drop Both Eyes BID  . lamoTRIgine  25 mg Oral QHS  . latanoprost  1 drop Both Eyes QHS  . LORazepam  0.25 mg Intravenous QHS,MR X 1  . memantine  10 mg Oral BID  . multivitamin with minerals  1 tablet Oral Daily  . omega-3 acid ethyl esters  1 g Oral Daily  . sodium chloride flush  3 mL Intravenous Q12H  . tamsulosin  0.4 mg Oral Daily  . zinc sulfate  220 mg Oral BID   Continuous: . sodium chloride 75 mL/hr at 10/28/15 1629  . heparin 1,200 Units/hr (10/28/15 16100632)    Assessment/Plan: 1. Acute Renal Failure: Secondary to urinary retention and dehydration. Cr improving with IV hydration and relief of urinary retention. Renal consulted. Renal dose meds for now.  2. Urinary Retention: 3 liters diuresed upon foley placement. Urology consulted. Will probably need foley for several weeks.  3. UTI: On abx. Culture neg so far.  4. Elevated Trop: Likely secondary to ARF. However, second trop slightly more elevated. Will go ahead and cycle two more troponins now that renal function better. If trends further up then will get cards consult. On heparin.  5. Bilateral Lower Ext DVT: On IV heparin. Left leg tightly swollen. Evaluated yesterday for possible IVC filter. Will not proceed with IVC placement unless does not tolerate anticoagulation. Change to oral anticoagulation in next day or two.  Time spent 45 min   LOS: 1 day   Gracelyn NurseJohnston,  Kory Panjwani D 10/28/2015,  4:54 PM

## 2015-10-28 NOTE — Consult Note (Signed)
Subjective: CC: Gross Hematuria  Hx:  Maurice Carney is a delightful 82 yo WM who I was asked to see in consultation by Dr Mady Haagensen for gross hematuria.   Maurice Carney had the onset last week of bilateral lower extremity edema and was [facility] on [facility]ix [facility].   He subsequently was admitted to the hospital with the edema, shortness of breath and back pain.  He was found on admission to have ARI with a Cr of 4.4 and a UTI.   He has a history of a TURP about 25 years ago and there was no reported voiding difficulty prior to admission.   He had a foley placed with some difficulty and subsequently developed gross hematuria after the bladder was drained of of urine.  He was also started on flomax.  He was found to have DVTs and is on Heparin pending placement of a [place] filter when his renal function recovers from the acute obstruction.  He had a renal US that showed bilateral renal cysts with a right renal cyst with internal septations.  The bladder was decompressed around the foley balloon.   The urine in the foley bag is markedly bloody but the most recent urine in the tubing is clearing.  ROS:  Review of Systems  Constitutional: Negative for fever and chills.  Respiratory: Positive for shortness of breath.   Cardiovascular: Negative for chest pain.  Gastrointestinal: Negative for abdominal pain.  All other systems reviewed and are negative.   Allergies  Allergen Reactions  . Sulfa Antibiotics     Past Medical History  Diagnosis Date  . Hypertension   . Hyperlipidemia   . Alzheimer disease   . MDD (major depressive disorder) (HCC)   . CKD (chronic kidney disease)     Past Surgical History  Procedure Laterality Date  . Transurethral resection of prostate      Social History   Social History  . Marital Status: Single    Spouse Name: N/A  . Number of Children: N/A  . Years of Education: N/A   Occupational History  . Not on file.   Social  History Main Topics  . Smoking status: Never Smoker   . Smokeless tobacco: Not on file  . Alcohol Use: No  . Drug Use: Not on file  . Sexual Activity: Not on file   Other Topics Concern  . Not on file   Social History Narrative    History reviewed. No pertinent family history.  Anti-infectives: Anti-infectives    Start     Dose/Rate Route Frequency Ordered Stop   10/27/15 1215  cefTRIAXone (ROCEPHIN) 1 g in dextrose 5 % 50 mL IVPB     1 g 100 mL/hr over 30 Minutes Intravenous Every 24 hours 10/27/15 1204     10/27/15 1115  vancomycin (VANCOCIN) IVPB 1000 mg/200 mL premix  Status:  Discontinued     1,000 mg 200 mL/hr over 60 Minutes Intravenous STAT 10/27/15 1101 10/27/15 1155   10/27/15 1115  piperacillin-tazobactam (ZOSYN) IVPB 3.375 g  Status:  Discontinued     3.375 g 100 mL/hr over 30 Minutes Intravenous  Once 10/27/15 1101 10/27/15 1155      Current Facility-Administered Medications  Medication Dose Route Frequency Provider Last Rate Last Dose  . 0.9 %  sodium chloride infusion   Intravenous Continuous Adrian Saran, MD 75 mL/hr at 10/27/15 1236    . acetaminophen (TYLENOL) tablet 650 mg  650 mg Oral Q6H PRN Sital  Mody, MD       Or  . acetaminophen (TYLENOL) suppository 650 mg  650 mg Rectal Q6H PRN Adrian Saran, MD      . acetaminophen (TYLENOL) tablet 650 mg  650 mg Oral QHS Adrian Saran, MD   650 mg at 10/27/15 2248  . amLODipine (NORVASC) tablet 5 mg  5 mg Oral Daily Adrian Saran, MD   5 mg at 10/28/15 1000  . atorvastatin (LIPITOR) tablet 20 mg  20 mg Oral q1800 Gracelyn Nurse, MD      . cefTRIAXone (ROCEPHIN) 1 g in dextrose 5 % 50 mL IVPB  1 g Intravenous Q24H Cindi Carbon, RPH 100 mL/hr at 10/28/15 1215 1 g at 10/28/15 1215  . dorzolamide (TRUSOPT) 2 % ophthalmic solution 1 drop  1 drop Both Eyes BID Adrian Saran, MD   1 drop at 10/28/15 1000  . heparin ADULT infusion 100 units/mL (25000 units/246mL sodium chloride 0.45%)  1,200 Units/hr Intravenous Continuous Adrian Saran,  MD 12 mL/hr at 10/28/15 0632 1,200 Units/hr at 10/28/15 1610  . lamoTRIgine (LAMICTAL) tablet 25 mg  25 mg Oral QHS Adrian Saran, MD   25 mg at 10/27/15 2248  . latanoprost (XALATAN) 0.005 % ophthalmic solution 1 drop  1 drop Both Eyes QHS Adrian Saran, MD   1 drop at 10/27/15 2247  . LORazepam (ATIVAN) injection 0.25 mg  0.25 mg Intravenous QHS,MR X 1 Gracelyn Nurse, MD      . memantine [facility]) tablet 10 mg  10 mg Oral BID Adrian Saran, MD   10 mg at 10/28/15 1000  . multivitamin with minerals tablet 1 tablet  1 tablet Oral Daily Adrian Saran, MD   1 tablet at 10/28/15 0931  . omega-3 acid ethyl esters (LOVAZA) capsule 1 g  1 g Oral Daily Adrian Saran, MD   1 g at 10/28/15 0931  . ondansetron (ZOFRAN) tablet 4 mg  4 mg Oral Q6H PRN Adrian Saran, MD       Or  . ondansetron (ZOFRAN) injection 4 mg  4 mg Intravenous Q6H PRN Sital Mody, MD      . polyethylene glycol (MIRALAX / GLYCOLAX) packet 17 g  17 g Oral Daily PRN Adrian Saran, MD      . senna-docusate (Senokot-S) tablet 1 tablet  1 tablet Oral QHS PRN Adrian Saran, MD      . sodium chloride flush (NS) 0.9 % injection 3 mL  3 mL Intravenous Q12H Sital Mody, MD   3 mL at 10/28/15 1000  . tamsulosin (FLOMAX) capsule 0.4 mg  0.4 mg Oral Daily Sital Mody, MD   0.4 mg at 10/28/15 1000  . zinc sulfate capsule 220 mg  220 mg Oral BID Adrian Saran, MD   220 mg at 10/28/15 9604    Past medical, surgical, family and social history reviewed.    Objective: Vital signs in last 24 hours: Temp:  [98 F (36.7 C)-99.2 F (37.3 C)] 99.2 F (37.3 C) (05/27 1139) Pulse Rate:  [86-96] 86 (05/27 1139) Resp:  [16-18] 18 (05/27 1139) BP: (113-174)/(47-69) 113/47 mmHg (05/27 1139) SpO2:  [89 %-94 %] 93 % (05/27 1139)  Intake/Output from previous day: 05/26 0701 - 05/27 0700 In: 593.2 [I.V.:543.2; IV Piggyback:50] Out: 7050 [Urine:7050] Intake/Output this shift: Total I/O In: 480 [P.O.:480] Out: -    Physical Exam  Constitutional: He is well-developed,  well-nourished, and in no distress.  HENT:  Head: Normocephalic and atraumatic.  Neck: Normal range of motion. Neck  supple. No thyromegaly present.  Cardiovascular: Normal rate, regular rhythm and normal heart sounds.   Pulmonary/Chest: Effort normal. No respiratory distress. He has rales (right base).  Abdominal: Soft. Bowel sounds are normal. He exhibits no distension and no mass. There is no tenderness. There is no guarding.  Genitourinary:  He has a normal phallus with a foley catheter.   Rectal exam deferred at this time.   Musculoskeletal: Normal range of motion. He exhibits edema. He exhibits no tenderness.  Lymphadenopathy:    He has no cervical adenopathy.  Neurological: He is alert.  No focal deficits  Skin: Skin is warm and dry. No erythema.  Psychiatric: Mood and affect normal.  Vitals reviewed.   Lab Results:   Recent Labs  10/27/15 0925 10/27/15 1618 10/27/15 1830 10/28/15 0423  WBC 15.6*  --   --  16.1*  HGB 11.7* 11.5* 12.3* 10.1*  HCT 34.8*  --   --  30.7*  PLT 159 152  --  153   BMET  Recent Labs  10/27/15 0925 10/28/15 0423  NA 139 138  K 4.9 4.4  CL 108 111  CO2 20* 19*  GLUCOSE 119* 107*  BUN 91* 72*  CREATININE 4.41* 2.86*  CALCIUM 8.9 8.1*   PT/INR  Recent Labs  10/27/15 1618  LABPROT 17.4*  INR 1.42   ABG No results for input(s): PHART, HCO3 in the last 72 hours.  Invalid input(s): PCO2, PO2  Studies/Results: Dg Chest 2 View  10/27/2015  CLINICAL DATA:  Worsening back pain. Shortness of breath. Leukocytosis. EXAM: CHEST  2 VIEW COMPARISON:  None, including search under other medical record numbers. FINDINGS: There is extensive opacification of right chest with pleural calcification, pleural thickening, and volume loss. The left lung is hyperinflated but clear. No convincing left-sided pleural calcification right-sided opacity could all be chronic, but cannot exclude pneumonia, complex pleural effusion, or malignancy. Normal heart  size and mediastinal contours. IMPRESSION: Extensive opacification of the right chest, constellation of findings suggesting fibrothorax. Cannot exclude superimposed acute or malignant pleural parenchymal disease and chest CT is suggested. Electronically Signed   By: Marnee Spring M.D.   On: 10/27/2015 10:36   Dg Lumbar Spine Complete  10/27/2015  CLINICAL DATA:  Worsening low back pain and shortness-of-breath today. EXAM: LUMBAR SPINE - COMPLETE 4+ VIEW COMPARISON:  None. FINDINGS: Mild curvature of the lumbar spine convex to the right. Vertebral body heights are within normal. There is moderate spondylosis throughout the lumbar spine to include facet arthropathy. Moderate disc space narrowing at the L2-3, L3-4 L4-5 levels. Significant calcified plaque over the abdominal aorta and iliac arteries. IMPRESSION: Moderate spondylosis of the lumbar spine with significant disc disease from the L2-3 level to the L4-5 level. Mild curvature convex right. Electronically Signed   By: Elberta Fortis M.D.   On: 10/27/2015 10:46   US Renal  10/28/2015  CLINICAL DATA:  Renal failure EXAM: RENAL / URINARY TRACT ULTRASOUND COMPLETE COMPARISON:  None. FINDINGS: Right Kidney: Length: 12.4 cm. Echogenicity within normal limits. No hydronephrosis visualized. 5.4 x 4 x 5.3 cm anechoic right renal mass consistent with a cyst. 4.6 x 4.5 x 4.4 cm anechoic right renal mass with thin septations most consistent with a mildly complicated cyst. Left Kidney: Length: 11.7 cm. Echogenicity within normal limits. No hydronephrosis visualized. Two anechoic left renal masses measuring 2.5 x 3 x 2.7 cm and 4.5 x 4.3 x 5.1 cm respectively. Bladder: Bladder wall thickening likely secondary to underdistention with a Foley catheter  present. IMPRESSION: 1. No obstructive uropathy. 2. Multiple bilateral renal cysts. 3. Mildly complicated right renal cyst with thin internal septations. Electronically Signed   By: Elige KoHetal  Patel   On: 10/28/2015 12:53    [place] Venous Img Lower Unilateral Left  10/27/2015  CLINICAL DATA:  Left leg swelling. EXAM: LEFT LOWER EXTREMITY VENOUS DOPPLER ULTRASOUND TECHNIQUE: Gray-scale sonography with graded compression, as well as color Doppler and duplex ultrasound were performed to evaluate the lower extremity deep venous systems from the level of the common femoral vein and including the common femoral, femoral, profunda femoral, popliteal and calf veins including the posterior tibial, peroneal and gastrocnemius veins when visible. The superficial great saphenous vein was also interrogated. Spectral Doppler was utilized to evaluate flow at rest and with distal augmentation maneuvers in the common femoral, femoral and popliteal veins. COMPARISON:  None. FINDINGS: Contralateral Common Femoral Vein: Occlusive thrombus noted within the right common femoral vein. Common Femoral Vein: Occlusive thrombus noted. Saphenofemoral Junction: Occlusive thrombus noted. Profunda Femoral Vein: Occlusive thrombus noted. Femoral Vein: Occlusive thrombus noted. Popliteal Vein: A occlusive thrombus noted. Calf Veins: Occlusive thrombus noted. Superficial Great Saphenous Vein: Non a well visualized. Venous Reflux:  None. Other Findings:  None. IMPRESSION: Extensive occlusive thrombus throughout the left lower extremity venous system. Occlusive thrombus is also noted in the contralateral right common femoral vein. These results will be called to the ordering clinician or representative by the Radiologist Assistant, and communication documented in the PACS or zVision Dashboard. Electronically Signed   By: Charlett NoseKevin  Dover M.D.   On: 10/27/2015 10:16   I have reviewed his labs and hospital notes.  I have reviewed his renal US films and report.     Urine culture is pending.   He had TNTC RBC and WBC on micro.  I irrigated his foley with about 200ml of NS with clear return.   Assessment: Gross hematuria is probably secondary to acute bladder  decompression and appears to have resolved.  Obstructive uropathy with ARI now resolving with decompression.  DVT on heparin pending [place].   No need to stop heparin with resolution of hematuria.   Acute retention with 3000ml bladder volume.   This degree of distention can injure the bladder wall and it make take severe weeks to months to recover.   He was started on flomax but has a sulfa allergy and should be stopped.  He is going to need catheter drainage for at least 2 weeks post discharge and should f/u in the office for consideration of a voiding trial and cystoscopy.      CC: Dr. Cherylann RatelLateef Mansoor and Dr. Adrian SaranSital Mody.      Moustapha Tooker J 10/28/2015 [phone]

## 2015-10-28 NOTE — Progress Notes (Signed)
ANTICOAGULATION CONSULT NOTE   Pharmacy Consult for Heparin Drip Indication: VTE prophylaxis  Allergies  Allergen Reactions  . Sulfa Antibiotics     Patient Measurements: Height: 6\' 4"  (193 cm) Weight: 175 lb (79.379 kg) IBW/kg (Calculated) : 86.8 Heparin Dosing Weight: 79 kg  Vital Signs: Temp: 98.5 F (36.9 C) (05/27 0432) Temp Source: Oral (05/27 0432) BP: 123/59 mmHg (05/27 0432) Pulse Rate: 92 (05/27 0432)  Labs:  Recent Labs  10/27/15 0925 10/27/15 1618 10/27/15 1830 10/27/15 2300 10/28/15 0423  HGB 11.7* 11.5* 12.3*  --  10.1*  HCT 34.8*  --   --   --  30.7*  PLT 159 152  --   --  153  APTT  --  >160*  --   --   --   LABPROT  --  17.4*  --   --   --   INR  --  1.42  --   --   --   HEPARINUNFRC  --   --  0.95*  --  0.50  CREATININE 4.41*  --   --   --  2.86*  TROPONINI 0.06* 0.08*  --  0.69*  --     Estimated Creatinine Clearance: 16.6 mL/min (by C-G formula based on Cr of 2.86).   Medical History: Past Medical History  Diagnosis Date  . Hypertension   . Hyperlipidemia   . Alzheimer disease   . MDD (major depressive disorder) (HCC)   . CKD (chronic kidney disease)     Medications:  Scheduled:  . acetaminophen  650 mg Oral QHS  . amLODipine  5 mg Oral Daily  . atorvastatin  20 mg Oral q1800  . cefTRIAXone (ROCEPHIN)  IV  1 g Intravenous Q24H  . dorzolamide  1 drop Both Eyes BID  . escitalopram  10 mg Oral Daily  . lamoTRIgine  25 mg Oral QHS  . latanoprost  1 drop Both Eyes QHS  . memantine  10 mg Oral BID  . multivitamin with minerals  1 tablet Oral Daily  . omega-3 acid ethyl esters  1 g Oral Daily  . sodium chloride flush  3 mL Intravenous Q12H  . tamsulosin  0.4 mg Oral Daily  . zinc sulfate  220 mg Oral BID   Infusions:  . sodium chloride 75 mL/hr at 10/27/15 1236  . heparin 1,200 Units/hr (10/27/15 1915)    Assessment: 82 yo male with bilateral DVT's ordered heparin drip for possible PE's. Patient receiving heparin  1350units/hr.   Goal of Therapy:  Heparin level 0.3-0.7 units/ml    Plan:  Heparin level therapeutic x 1. Continue current rate. Will recheck level in 8 hours.  Pharmacy will continue to monitor and adjust per consult.   Carola FrostNathan A Lamira Borin, Pharm.D., BCPS Clinical Pharmacist  10/28/2015,5:22 AM

## 2015-10-28 NOTE — Progress Notes (Signed)
*  PRELIMINARY RESULTS* Echocardiogram 2D Echocardiogram has been performed.  Maurice Carney 10/28/2015, 3:05 PM

## 2015-10-29 ENCOUNTER — Encounter: Payer: Self-pay | Admitting: Cardiovascular Disease

## 2015-10-29 ENCOUNTER — Inpatient Hospital Stay: Payer: Medicare Other

## 2015-10-29 DIAGNOSIS — I493 Ventricular premature depolarization: Secondary | ICD-10-CM

## 2015-10-29 DIAGNOSIS — E785 Hyperlipidemia, unspecified: Secondary | ICD-10-CM

## 2015-10-29 DIAGNOSIS — Z7901 Long term (current) use of anticoagulants: Secondary | ICD-10-CM

## 2015-10-29 DIAGNOSIS — I10 Essential (primary) hypertension: Secondary | ICD-10-CM

## 2015-10-29 DIAGNOSIS — I2489 Other forms of acute ischemic heart disease: Secondary | ICD-10-CM

## 2015-10-29 DIAGNOSIS — I350 Nonrheumatic aortic (valve) stenosis: Secondary | ICD-10-CM

## 2015-10-29 DIAGNOSIS — N19 Unspecified kidney failure: Secondary | ICD-10-CM

## 2015-10-29 DIAGNOSIS — I248 Other forms of acute ischemic heart disease: Secondary | ICD-10-CM

## 2015-10-29 DIAGNOSIS — I82413 Acute embolism and thrombosis of femoral vein, bilateral: Secondary | ICD-10-CM | POA: Insufficient documentation

## 2015-10-29 HISTORY — DX: Essential (primary) hypertension: I10

## 2015-10-29 HISTORY — DX: Other forms of acute ischemic heart disease: I24.89

## 2015-10-29 HISTORY — DX: Nonrheumatic aortic (valve) stenosis: I35.0

## 2015-10-29 HISTORY — DX: Ventricular premature depolarization: I49.3

## 2015-10-29 HISTORY — DX: Other forms of acute ischemic heart disease: I24.8

## 2015-10-29 LAB — BASIC METABOLIC PANEL
Anion gap: 7 (ref 5–15)
BUN: 64 mg/dL — AB (ref 6–20)
CHLORIDE: 109 mmol/L (ref 101–111)
CO2: 22 mmol/L (ref 22–32)
Calcium: 8.3 mg/dL — ABNORMAL LOW (ref 8.9–10.3)
Creatinine, Ser: 2.16 mg/dL — ABNORMAL HIGH (ref 0.61–1.24)
GFR calc Af Amer: 28 mL/min — ABNORMAL LOW (ref >60)
GFR calc non Af Amer: 24 mL/min — ABNORMAL LOW (ref >60)
GLUCOSE: 115 mg/dL — AB (ref 65–99)
POTASSIUM: 4.7 mmol/L (ref 3.5–5.1)
Sodium: 138 mmol/L (ref 135–145)

## 2015-10-29 LAB — TROPONIN I
Troponin I: 3.49 ng/mL — ABNORMAL HIGH (ref <0.031)
Troponin I: 3.21 ng/mL — ABNORMAL HIGH (ref <0.031)

## 2015-10-29 LAB — CBC
HCT: 30.9 % — ABNORMAL LOW (ref 40.0–52.0)
HEMOGLOBIN: 9.9 g/dL — AB (ref 13.0–18.0)
MCH: 28.4 pg (ref 26.0–34.0)
MCHC: 32.2 g/dL (ref 32.0–36.0)
MCV: 88.1 fL (ref 80.0–100.0)
PLATELETS: 168 10*3/uL (ref 150–440)
RBC: 3.5 MIL/uL — AB (ref 4.40–5.90)
RDW: 14.5 % (ref 11.5–14.5)
WBC: 10.8 10*3/uL — ABNORMAL HIGH (ref 3.8–10.6)

## 2015-10-29 LAB — URINE CULTURE

## 2015-10-29 LAB — HEPARIN LEVEL (UNFRACTIONATED): Heparin Unfractionated: 0.47 IU/mL (ref 0.30–0.70)

## 2015-10-29 MED ORDER — IPRATROPIUM-ALBUTEROL 0.5-2.5 (3) MG/3ML IN SOLN
3.0000 mL | Freq: Four times a day (QID) | RESPIRATORY_TRACT | Status: DC
  Administered 2015-10-29 – 2015-10-31 (×7): 3 mL via RESPIRATORY_TRACT
  Filled 2015-10-29 (×7): qty 3

## 2015-10-29 MED ORDER — CIPROFLOXACIN HCL 500 MG PO TABS
250.0000 mg | ORAL_TABLET | ORAL | Status: DC
  Administered 2015-10-29 – 2015-10-30 (×3): 250 mg via ORAL
  Filled 2015-10-29 (×3): qty 1

## 2015-10-29 MED ORDER — FUROSEMIDE 10 MG/ML IJ SOLN
20.0000 mg | INTRAMUSCULAR | Status: AC
  Administered 2015-10-29: 20 mg via INTRAVENOUS

## 2015-10-29 MED ORDER — LORAZEPAM 2 MG/ML IJ SOLN
0.5000 mg | Freq: Every evening | INTRAMUSCULAR | Status: DC | PRN
  Filled 2015-10-29: qty 1

## 2015-10-29 MED ORDER — FUROSEMIDE 10 MG/ML IJ SOLN
INTRAMUSCULAR | Status: AC
  Administered 2015-10-29: 20 mg via INTRAVENOUS
  Filled 2015-10-29: qty 2

## 2015-10-29 MED ORDER — MORPHINE SULFATE (PF) 2 MG/ML IV SOLN
1.0000 mg | INTRAVENOUS | Status: DC | PRN
  Administered 2015-10-29 – 2015-10-31 (×3): 1 mg via INTRAVENOUS
  Filled 2015-10-29 (×3): qty 1

## 2015-10-29 MED ORDER — FUROSEMIDE 10 MG/ML IJ SOLN
20.0000 mg | Freq: Once | INTRAMUSCULAR | Status: AC
  Administered 2015-10-29: 20 mg via INTRAVENOUS

## 2015-10-29 MED ORDER — IPRATROPIUM-ALBUTEROL 0.5-2.5 (3) MG/3ML IN SOLN
RESPIRATORY_TRACT | Status: AC
Start: 2015-10-29 — End: 2015-10-29
  Administered 2015-10-29: 3 mL via RESPIRATORY_TRACT
  Filled 2015-10-29: qty 3

## 2015-10-29 NOTE — Progress Notes (Signed)
RN rounding on patient and came in right after daughter reports she witness patient choking on some of his nutritional drink. RN checked sats which were 75-76% on room air, crackles auscultated bilaterally. Patient placed on [place] which did not elevate sats above 78%, sats started to normalize with application of NRB to 88%. Dr. Letitia LibraJohnston paged and made aware, orders for duonebs, 20iv lasix, stat portable cxr, [facility]. Cherylann RatelLateef paged and made aware and another order for 20iv lasix received. After interventions, crackles decreased somewhat, patient able to graduate to 6L [place] and maintain SpO2 at 95-96%. Patient appears worn out from the stress, continues to cough and have crackles bilaterally. Urine output in foley is minimal at this point and does not seem to have increased much (~15200ml increase) since IV lasix administered. Will continue to monitor closely, continuous pulse ox in place. Daughter at bedside to comfort patient.

## 2015-10-29 NOTE — Progress Notes (Addendum)
[facility]  ROUNDING NOTE   Subjective:  Creatinine down to 2.16 today. Good urine output noted. Troponin also mildly elevated at 3.49. Daughter updated as to condition.   Objective:  Vital signs in last 24 hours:  Temp:  [97.7 F (36.5 C)-98 F (36.7 C)] 97.7 F (36.5 C) (05/28 0426) Pulse Rate:  [60-83] 72 (05/28 1447) Resp:  [18-22] 22 (05/28 1447) BP: (117-178)/(50-67) 178/67 mmHg (05/28 1447) SpO2:  [85 %-99 %] 94 % (05/28 1504)  Weight change:  Filed Weights   10/27/15 1101  Weight: 79.379 kg (175 lb)    Intake/Output: I/O last 3 completed shifts: In: 1767 [P.O.:720; I.V.:1047] Out: 5000 [Urine:5000]   Intake/Output this shift:     Physical Exam: General: NAD, resting in bed  Head: Normocephalic, atraumatic. Moist oral mucosal membranes  Eyes: Anicteric  Neck: Supple, trachea midline  Lungs:  Clear to auscultation  Heart: S1S2 no rubs  Abdomen:  Soft, nontender, BS present   Extremities: L>R peripheral edema.  Neurologic: Nonfocal, moving all four extremities  Skin: No lesions       Basic Metabolic Panel:  Recent Labs Lab 10/27/15 0925 10/28/15 0423 10/29/15 0431  NA 139 138 138  K 4.9 4.4 4.7  CL 108 111 109  CO2 20* 19* 22  GLUCOSE 119* 107* 115*  BUN 91* 72* 64*  CREATININE 4.41* 2.86* 2.16*  CALCIUM 8.9 8.1* 8.3*    Liver Function Tests: No results for input(s): AST, ALT, ALKPHOS, BILITOT, PROT, ALBUMIN in the last 168 hours. No results for input(s): LIPASE, AMYLASE in the last 168 hours. No results for input(s): AMMONIA in the last 168 hours.  CBC:  Recent Labs Lab 10/27/15 0925 10/27/15 1618 10/27/15 1830 10/28/15 0423 10/29/15 0431  WBC 15.6*  --   --  16.1* 10.8*  NEUTROABS 13.0*  --   --   --   --   HGB 11.7* 11.5* 12.3* 10.1* 9.9*  HCT 34.8*  --   --  30.7* 30.9*  MCV 86.4  --   --  87.0 88.1  PLT 159 152  --  153 168    Cardiac Enzymes:  Recent Labs Lab 10/27/15 2300 10/28/15 1703  10/28/15 2223 10/29/15 0431 10/29/15 1056  TROPONINI 0.69* 3.19* 2.82* 3.49* 3.21*    BNP: Invalid input(s): POCBNP  CBG: No results for input(s): GLUCAP in the last 168 hours.  Microbiology: Results for orders placed or performed during the hospital encounter of 10/27/15  Blood culture (routine x 2)     Status: None (Preliminary result)   Collection Time: 10/27/15  9:25 AM  Result Value Ref Range Status   Specimen Description BLOOD LEFT FOREARM  Final   Special Requests BOTTLES DRAWN AEROBIC AND ANAEROBIC  10CC  Final   Culture NO GROWTH 2 DAYS  Final   Report Status PENDING  Incomplete  Blood culture (routine x 2)     Status: None (Preliminary result)   Collection Time: 10/27/15  9:25 AM  Result Value Ref Range Status   Specimen Description BLOOD LEFT ASSIST CONTROL  Final   Special Requests BOTTLES DRAWN AEROBIC AND ANAEROBIC  7CC  Final   Culture NO GROWTH 2 DAYS  Final   Report Status PENDING  Incomplete  Urine culture     Status: Abnormal   Collection Time: 10/27/15  9:25 AM  Result Value Ref Range Status   Specimen Description URINE, RANDOM  Final   Special Requests NONE  Final   Culture (A)  Final    >=100,000 COLONIES/mL STAPHYLOCOCCUS SPECIES (COAGULASE NEGATIVE)   Report Status 10/29/2015 FINAL  Final   Organism ID, Bacteria STAPHYLOCOCCUS SPECIES (COAGULASE NEGATIVE) (A)  Final      Susceptibility   Staphylococcus species (coagulase negative) - MIC*    CIPROFLOXACIN 2 INTERMEDIATE Intermediate     GENTAMICIN <=0.5 SENSITIVE Sensitive     NITROFURANTOIN <=16 SENSITIVE Sensitive     OXACILLIN Value in next row Resistant      >=4 RESISTANTWARNING: For oxacillin-resistant S.aureus and coagulase-negative staphylococci (MRS), other beta-lactam agents, ie, penicillins, beta-lactam/beta-lactamase inhibitor combinations, cephems (with the exception of the cephalosporins with anti-MRSA activity), and carbapenems, may appear active in vitro, but are not effective  clinically.  --CLSI, Vol.32 No.3, January 2012, pg 70.    TETRACYCLINE Value in next row Sensitive      >=4 RESISTANTWARNING: For oxacillin-resistant S.aureus and coagulase-negative staphylococci (MRS), other beta-lactam agents, ie, penicillins, beta-lactam/beta-lactamase inhibitor combinations, cephems (with the exception of the cephalosporins with anti-MRSA activity), and carbapenems, may appear active in vitro, but are not effective clinically.  --CLSI, Vol.32 No.3, January 2012, pg 70.    VANCOMYCIN Value in next row Sensitive      >=4 RESISTANTWARNING: For oxacillin-resistant S.aureus and coagulase-negative staphylococci (MRS), other beta-lactam agents, ie, penicillins, beta-lactam/beta-lactamase inhibitor combinations, cephems (with the exception of the cephalosporins with anti-MRSA activity), and carbapenems, may appear active in vitro, but are not effective clinically.  --CLSI, Vol.32 No.3, January 2012, pg 70.    TRIMETH/SULFA Value in next row Sensitive      >=4 RESISTANTWARNING: For oxacillin-resistant S.aureus and coagulase-negative staphylococci (MRS), other beta-lactam agents, ie, penicillins, beta-lactam/beta-lactamase inhibitor combinations, cephems (with the exception of the cephalosporins with anti-MRSA activity), and carbapenems, may appear active in vitro, but are not effective clinically.  --CLSI, Vol.32 No.3, January 2012, pg 70.    CLINDAMYCIN Value in next row Sensitive      >=4 RESISTANTWARNING: For oxacillin-resistant S.aureus and coagulase-negative staphylococci (MRS), other beta-lactam agents, ie, penicillins, beta-lactam/beta-lactamase inhibitor combinations, cephems (with the exception of the cephalosporins with anti-MRSA activity), and carbapenems, may appear active in vitro, but are not effective clinically.  --CLSI, Vol.32 No.3, January 2012, pg 70.    RIFAMPIN Value in next row Sensitive      >=4 RESISTANTWARNING: For oxacillin-resistant S.aureus and coagulase-negative  staphylococci (MRS), other beta-lactam agents, ie, penicillins, beta-lactam/beta-lactamase inhibitor combinations, cephems (with the exception of the cephalosporins with anti-MRSA activity), and carbapenems, may appear active in vitro, but are not effective clinically.  --CLSI, Vol.32 No.3, January 2012, pg 70.    * >=100,000 COLONIES/mL STAPHYLOCOCCUS SPECIES (COAGULASE NEGATIVE)  MRSA PCR Screening     Status: None   Collection Time: 10/27/15  5:14 PM  Result Value Ref Range Status   MRSA by PCR NEGATIVE NEGATIVE Final    Comment:        The GeneXpert MRSA Assay (FDA approved for NASAL specimens only), is one component of a comprehensive MRSA colonization surveillance program. It is not intended to diagnose MRSA infection nor to guide or monitor treatment for MRSA infections.     Coagulation Studies:  Recent Labs  10/27/15 1618  LABPROT 17.4*  INR 1.42    Urinalysis:  Recent Labs  10/27/15 0925  COLORURINE YELLOW*  LABSPEC 1.012  PHURINE 5.0  GLUCOSEU NEGATIVE  HGBUR 2+*  BILIRUBINUR NEGATIVE  KETONESUR NEGATIVE  PROTEINUR NEGATIVE  NITRITE NEGATIVE  LEUKOCYTESUR 3+*      Imaging: US Renal  10/28/2015  CLINICAL DATA:  Renal failure  EXAM: RENAL / URINARY TRACT ULTRASOUND COMPLETE COMPARISON:  None. FINDINGS: Right Kidney: Length: 12.4 cm. Echogenicity within normal limits. No hydronephrosis visualized. 5.4 x 4 x 5.3 cm anechoic right renal mass consistent with a cyst. 4.6 x 4.5 x 4.4 cm anechoic right renal mass with thin septations most consistent with a mildly complicated cyst. Left Kidney: Length: 11.7 cm. Echogenicity within normal limits. No hydronephrosis visualized. Two anechoic left renal masses measuring 2.5 x 3 x 2.7 cm and 4.5 x 4.3 x 5.1 cm respectively. Bladder: Bladder wall thickening likely secondary to underdistention with a Foley catheter present. IMPRESSION: 1. No obstructive uropathy. 2. Multiple bilateral renal cysts. 3. Mildly complicated right  renal cyst with thin internal septations. Electronically Signed   By: Elige KoHetal  Patel   On: 10/28/2015 12:53   Dg Chest Port 1 View  10/29/2015  CLINICAL DATA:  Renal failure, history of tuberculosis in the right lung EXAM: PORTABLE CHEST 1 VIEW COMPARISON:  10/27/2015 FINDINGS: Cardiac shadow is stable. Chronic changes in the right hemi thorax are again noted and stable. The left lung remains clear. No sizable effusion or focal infiltrate is noted. No acute bony abnormality is seen. IMPRESSION: Chronic changes in the right hemi thorax stable from the prior exam. Electronically Signed   By: Alcide CleverMark  Lukens M.D.   On: 10/29/2015 15:19     Medications:   . sodium chloride 10 mL/hr at 10/29/15 1453  . heparin 1,200 Units/hr (10/29/15 0044)   . acetaminophen  650 mg Oral QHS  . amLODipine  5 mg Oral Daily  . ciprofloxacin  250 mg Oral Q18H  . dorzolamide  1 drop Both Eyes BID  . ipratropium-albuterol  3 mL Nebulization Q6H  . lamoTRIgine  25 mg Oral QHS  . latanoprost  1 drop Both Eyes QHS  . LORazepam  0.5 mg Intravenous QHS,MR X 1  . memantine  10 mg Oral BID  . metoprolol tartrate  12.5 mg Oral BID  . multivitamin with minerals  1 tablet Oral Daily  . omega-3 acid ethyl esters  1 g Oral Daily  . sodium chloride flush  3 mL Intravenous Q12H  . tamsulosin  0.4 mg Oral Daily  . zinc sulfate  220 mg Oral BID   acetaminophen **OR** acetaminophen, ondansetron **OR** ondansetron (ZOFRAN) IV, polyethylene glycol, senna-docusate  Assessment/ Plan:  82 y.o. male with a PMHx of Hypertension, hyperlipidemia, Alzheimer's disease, depression, chronic kidney disease stage III, who [facility] for evaluation of bilateral lower extremity edema left greater than right, found to have acute DVT.   1. Acute renal failure/CKD stage III. Baseline creatinine reported to us as being 1.5. Presenting creatinine was above 4. Acute renal failure likely related to urinary retention as well as  recent furosemide use. -  Renal function is slowly improving. Creatinine now down to 2.16. Good urine output of 2.1 L noted.  2. Urinary retention with subsequent gross hematuria/history of uretheral stricture/Complicated right renal cyst. Patient with difficult Foley placement. He apparently has history of urethral stricture. . Incidentally he was also found to have a complicated right renal cyst with thin septations.  - Appreciate urology input. Foley catheter will need to stay in place at this point in time. Continue to monitor urine output and creatinine trend as above.  3. Anemia of chronic kidney disease. Hemoglobin currently 9.9. Continue to monitor. No urgent indication for Procrit at the moment.   LOS: 2 Virna Livengood 5/28/20174:25 PM

## 2015-10-29 NOTE — Progress Notes (Signed)
ANTICOAGULATION CONSULT NOTE   Pharmacy Consult for Heparin Drip Indication: VTE prophylaxis  Allergies  Allergen Reactions  . Sulfa Antibiotics     Patient Measurements: Height: 6\' 4"  (193 cm) Weight: 175 lb (79.379 kg) IBW/kg (Calculated) : 86.8 Heparin Dosing Weight: 79 kg  Vital Signs: Temp: 97.7 F (36.5 C) (05/28 0426) Temp Source: Oral (05/28 0426) BP: 117/58 mmHg (05/28 0426) Pulse Rate: 83 (05/28 0438)  Labs:  Recent Labs  10/27/15 0925 10/27/15 1618  10/28/15 0423 10/28/15 1151 10/28/15 1703 10/28/15 2223 10/29/15 0431  HGB 11.7* 11.5*  < > 10.1*  --   --   --  9.9*  HCT 34.8*  --   --  30.7*  --   --   --  30.9*  PLT 159 152  --  153  --   --   --  168  APTT  --  >160*  --   --   --   --   --   --   LABPROT  --  17.4*  --   --   --   --   --   --   INR  --  1.42  --   --   --   --   --   --   HEPARINUNFRC  --   --   < > 0.50 0.39  --   --  0.47  CREATININE 4.41*  --   --  2.86*  --   --   --  2.16*  TROPONINI 0.06* 0.08*  < >  --   --  3.19* 2.82* 3.49*  < > = values in this interval not displayed.  Estimated Creatinine Clearance: 22 mL/min (by C-G formula based on Cr of 2.16).   Medical History: Past Medical History  Diagnosis Date  . Hypertension   . Hyperlipidemia   . Alzheimer disease   . MDD (major depressive disorder) (HCC)   . CKD (chronic kidney disease)     Medications:  Scheduled:  . acetaminophen  650 mg Oral QHS  . amLODipine  5 mg Oral Daily  . cefTRIAXone (ROCEPHIN)  IV  1 g Intravenous Q24H  . dorzolamide  1 drop Both Eyes BID  . lamoTRIgine  25 mg Oral QHS  . latanoprost  1 drop Both Eyes QHS  . LORazepam  0.25 mg Intravenous QHS,MR X 1  . memantine  10 mg Oral BID  . metoprolol tartrate  12.5 mg Oral BID  . multivitamin with minerals  1 tablet Oral Daily  . omega-3 acid ethyl esters  1 g Oral Daily  . sodium chloride flush  3 mL Intravenous Q12H  . tamsulosin  0.4 mg Oral Daily  . zinc sulfate  220 mg Oral BID    Infusions:  . sodium chloride 75 mL/hr at 10/28/15 1629  . heparin 1,200 Units/hr (10/29/15 0044)    Assessment: 82 yo male with bilateral DVT's ordered heparin drip for possible PE's. Patient receiving heparin 1350units/hr.   Goal of Therapy:  Heparin level 0.3-0.7 units/ml    Plan:  Heparin level therapeutic. Continue current rate. Pharmacy will continue to monitor daily.  Carola FrostNathan A Leydy Worthey, Pharm.D., BCPS Clinical Pharmacist  10/29/2015,5:32 AM

## 2015-10-29 NOTE — Consult Note (Addendum)
Patient ID: Maurice Carney MRN: [id number] DOB/AGE: Aug 19, 1916 82 y.o.  Admit date: 10/27/2015 Primary Physician Dr. Hedwig Morton house calls Primary Cardiologist: none Requesting Physician: Dr. Adrian Saran  Chief Complaint  Elevated troponin  HPI: Maurice Carney is a 51M with dementia and hypertension who presented to the ED with shortness of breath and lower extremity edema.  His daughter is a Engineer, civil (consulting) at [employer].  She notes that her father recently moved to [facility] with his wife, who also has demential.  Since then he has actually been more active than they previously were living at home alone. However, over the last couple weeks she has noted increased swelling in his left lower extremity. He was seen by one of the physician assistants at [facility] making house calls, who felt that it was likely due to volume overload. He was started on Lasix but the swelling worsened. She also noted that he became increasingly short of breath and he was requiring 4 pillows to sleep at night. His oxygen saturation was in the 80s on room air, so they brought him to the emergency department for evaluation.  He had lower extremity Dopplers that revealed bilateral DVTs and was started on heparin for anticoagulation. He was also noted to have urinary retention and had a Foley catheter-based, at which time 3 L of urine were released. The catheterization was somewhat traumatic and he has had hematuria that is now resolving.  He was noted to have an elevated troponin so cardiology was consulted.  Maurice Carney denies chest pain. He currently states that he is feeling well and does not have any shortness of breath.  Review of Systems: A 12 point review of systems was obtained and was negative with exceptions as noted in the history of present illness.   Past Medical History  Diagnosis Date  . Hypertension   . Hyperlipidemia   . Alzheimer disease   . MDD (major depressive disorder) (HCC)   . CKD (chronic kidney  disease)     Medications Prior to Admission  Medication Sig Dispense Refill  . acetaminophen (TYLENOL) 325 MG tablet Take 650 mg by mouth at bedtime.    Marland Kitchen amLODipine (NORVASC) 5 MG tablet Take 5 mg by mouth daily.    Marland Kitchen aspirin EC 81 MG tablet Take 81 mg by mouth daily.    . dorzolamide (TRUSOPT) 2 % ophthalmic solution Place 1 drop into both eyes 2 (two) times daily.    Marland Kitchen escitalopram (LEXAPRO) 10 MG tablet Take 10 mg by mouth daily.    Marland Kitchen lamoTRIgine (LAMICTAL) 25 MG tablet Take 25 mg by mouth daily.    Marland Kitchen latanoprost (XALATAN) 0.005 % ophthalmic solution Place 1 drop into both eyes at bedtime.    . memantine (NAMENDA) 10 MG tablet Take 10 mg by mouth 2 (two) times daily.    . Multiple Vitamin (MULTIVITAMIN WITH MINERALS) TABS tablet Take 1 tablet by mouth daily.    Marland Kitchen neomycin-bacitracin-polymyxin (NEOSPORIN) ointment Apply 1 application topically daily. Apply to left 3rd toe daily until healed.    Marland Kitchen omega-3 acid ethyl esters (LOVAZA) 1 g capsule Take 1 g by mouth daily.    . polyethylene glycol (MIRALAX / GLYCOLAX) packet Take 17 g by mouth daily as needed for mild constipation or moderate constipation.    . simvastatin (ZOCOR) 40 MG tablet Take 40 mg by mouth at bedtime.    Marland Kitchen zinc sulfate 220 (50 Zn) MG capsule Take 220 mg by mouth 2 (two) times daily.       Marland Kitchen  acetaminophen  650 mg Oral QHS  . amLODipine  5 mg Oral Daily  . ciprofloxacin  250 mg Oral Q18H  . dorzolamide  1 drop Both Eyes BID  . lamoTRIgine  25 mg Oral QHS  . latanoprost  1 drop Both Eyes QHS  . LORazepam  0.5 mg Intravenous QHS,MR X 1  . memantine  10 mg Oral BID  . metoprolol tartrate  12.5 mg Oral BID  . multivitamin with minerals  1 tablet Oral Daily  . omega-3 acid ethyl esters  1 g Oral Daily  . sodium chloride flush  3 mL Intravenous Q12H  . tamsulosin  0.4 mg Oral Daily  . zinc sulfate  220 mg Oral BID    Infusions: . sodium chloride 75 mL/hr at 10/28/15 1629  . heparin 1,200 Units/hr (10/29/15 0044)      Allergies  Allergen Reactions  . Sulfa Antibiotics     Social History   Social History  . Marital Status: Single    Spouse Name: N/A  . Number of Children: N/A  . Years of Education: N/A   Occupational History  . Not on file.   Social History Main Topics  . Smoking status: Never Smoker   . Smokeless tobacco: Not on file  . Alcohol Use: No  . Drug Use: Not on file  . Sexual Activity: Not on file   Other Topics Concern  . Not on file   Social History Narrative    History reviewed. No pertinent family history.  PHYSICAL EXAM: Filed Vitals:   10/29/15 0438 10/29/15 1109  BP:  139/52  Pulse: 83 75  Temp:    Resp:       Intake/Output Summary (Last 24 hours) at 10/29/15 1217 Last data filed at 10/29/15 1002  Gross per 24 hour  Intake   1527 ml  Output   2150 ml  Net   -623 ml    General:  Well appearing, elderly man. No respiratory difficulty HEENT: normal Neck: supple. no JVD. Carotids 2+ bilat; no bruits. No lymphadenopathy or thryomegaly appreciated. Cor: PMI nondisplaced. Regular rate & rhythm. No rubs, gallops.  I/VI systolic murmur at LUISB. Lungs: Mild expiratory wheezes in the left lung field.  Abdomen: soft, nontender, nondistended. No hepatosplenomegaly. No bruits or masses. Good bowel sounds. Extremities: no cyanosis, clubbing, rash.  2+ pitting edema in the left lower extremity, 1+ pitting edema in the right lower extremity.  Neuro: alert & oriented x 3, cranial nerves grossly intact. moves all 4 extremities w/o difficulty. Affect pleasant.  Results for orders placed or performed during the hospital encounter of 10/27/15 (from the past 24 hour(s))  Troponin I     Status: Abnormal   Collection Time: 10/28/15  5:03 PM  Result Value Ref Range   Troponin I 3.19 (H) <0.031 ng/mL  Troponin I     Status: Abnormal   Collection Time: 10/28/15 10:23 PM  Result Value Ref Range   Troponin I 2.82 (H) <0.031 ng/mL  Troponin I     Status: Abnormal    Collection Time: 10/29/15  4:31 AM  Result Value Ref Range   Troponin I 3.49 (H) <0.031 ng/mL  Heparin level (unfractionated)     Status: None   Collection Time: 10/29/15  4:31 AM  Result Value Ref Range   Heparin Unfractionated 0.47 0.30 - 0.70 IU/mL  CBC     Status: Abnormal   Collection Time: 10/29/15  4:31 AM  Result Value Ref Range   WBC  10.8 (H) 3.8 - 10.6 K/uL   RBC 3.50 (L) 4.40 - 5.90 MIL/uL   Hemoglobin 9.9 (L) 13.0 - 18.0 g/dL   HCT 16.1 (L) 09.6 - 04.5 %   MCV 88.1 80.0 - 100.0 fL   MCH 28.4 26.0 - 34.0 pg   MCHC 32.2 32.0 - 36.0 g/dL   RDW 40.9 81.1 - 91.4 %   Platelets 168 150 - 440 K/uL  Basic metabolic panel     Status: Abnormal   Collection Time: 10/29/15  4:31 AM  Result Value Ref Range   Sodium 138 135 - 145 mmol/L   Potassium 4.7 3.5 - 5.1 mmol/L   Chloride 109 101 - 111 mmol/L   CO2 22 22 - 32 mmol/L   Glucose, Bld 115 (H) 65 - 99 mg/dL   BUN 64 (H) 6 - 20 mg/dL   Creatinine, Ser 7.82 (H) 0.61 - 1.24 mg/dL   Calcium 8.3 (L) 8.9 - 10.3 mg/dL   GFR calc non Af Amer 24 (L) >60 mL/min   GFR calc Af Amer 28 (L) >60 mL/min   Anion gap 7 5 - 15  Troponin I     Status: Abnormal   Collection Time: 10/29/15 10:56 AM  Result Value Ref Range   Troponin I 3.21 (H) <0.031 ng/mL   US Renal  10/28/2015  CLINICAL DATA:  Renal failure EXAM: RENAL / URINARY TRACT ULTRASOUND COMPLETE COMPARISON:  None. FINDINGS: Right Kidney: Length: 12.4 cm. Echogenicity within normal limits. No hydronephrosis visualized. 5.4 x 4 x 5.3 cm anechoic right renal mass consistent with a cyst. 4.6 x 4.5 x 4.4 cm anechoic right renal mass with thin septations most consistent with a mildly complicated cyst. Left Kidney: Length: 11.7 cm. Echogenicity within normal limits. No hydronephrosis visualized. Two anechoic left renal masses measuring 2.5 x 3 x 2.7 cm and 4.5 x 4.3 x 5.1 cm respectively. Bladder: Bladder wall thickening likely secondary to underdistention with a Foley catheter present.  IMPRESSION: 1. No obstructive uropathy. 2. Multiple bilateral renal cysts. 3. Mildly complicated right renal cyst with thin internal septations. Electronically Signed   By: Elige Ko   On: 10/28/2015 12:53    ECG: Sinus rhythm.  Rate 80 bpm. PAC.  Telemetry: Sinus rhythm with frequent PVCs and ventricular bigeminy.   Echo 10/28/15: Study Conclusions  - Procedure narrative: Transthoracic echocardiography. Image  quality was suboptimal. The study was technically difficult, as a  result of poor acoustic windows and poor sound wave transmission. - Left ventricle: Not able to accurately assess LV function due to  poor visualization. There appears to be hypokinesis of the  inferoseptum. However, endocardial border definition is poor.  Suggest repeating the study with echo contrast for mor accurate  LVEF and wall motion assessment. The cavity size was normal.  Systolic function was normal. Wall motion was normal; there were  no regional wall motion abnormalities. Doppler parameters are  consistent with abnormal left ventricular relaxation (grade 1  diastolic dysfunction). Doppler parameters are consistent with  indeterminate ventricular filling pressure. - Aortic valve: At least moderately thickened, moderately calcified  leaflets. There was moderate stenosis. There was no  regurgitation. - Mitral valve: There was trivial regurgitation. - Tricuspid valve: There was no regurgitation.    ASSESSMENT/PLAN:  # Demand Ischemia: Maurice Carney had a troponin that peaked at 3.49 and is down to 3.2 once a day. He denies any chest pain or pressure. It is very likely that his troponin is elevated due to demand  ischemia from both urinary retention as well as poor clearance in the setting of acute renal failure. Given that he has no ischemic symptoms and EKG is not concerning for ischemia, we will not do any further evaluation at this time. Also, he is 82 years old and will require  anticoagulation for his DVT. He is already a fall risk, so the threshold would be very high for taking him for cardiac catheterization.  It is possible that the troponin is elevated due to the right heart strain in the setting of pulmonary embolism. I was unable to get a good view of his right ventricle on ETT.  It appeared grossly normal. He does not have any evidence of right ventricular failure on exam as he has no RV heave or elevated JVP. Either way, the treatment would be anticoagulation so there is no indication for further evaluation at this time.   # Hypertension: Blood pressure is well-controlled.  Continue amlodipine.   # PVCs: Maurice Carney has frequent ventricular ectopy.  Metoprolol was added this admission. We'll continue to monitor. He is asymptomatic.   # DVT: He is currently being anticoagulated with heparin. Once we determine where his renal function levels out a decision can be made about oral anticoagulation.  We will order TED hose.  # Hyperlipidemia: Continue simvastatin.  # Moderate aortic stenosis: Echo revealed moderate aortic stenosis.  This can be monitored in the outpatient setting and does not require any intervention at this time. He would not be a candidate for TAVR given his advanced age and dementia.   # LVEF assessment: Echo was not adequate for assessment of left ventricular function. We will repeat the echo with Definity contrast.  However, this likely cannot occur until Tuesday.  If the patient is ready for discharge before this happens it can be done as an outpatient.    Signed: Leliana Kontz C. Duke Salvia, MD, [facility]  10/29/2015, 12:17 PM

## 2015-10-29 NOTE — Progress Notes (Signed)
Catheter is draining clear yellow urine - recommend keeping foley in place for at least a week to allow his stretch injury to heal.  At that point he should be given a voiding trial when clinically appropriate.

## 2015-10-29 NOTE — Progress Notes (Signed)
Subjective: Patient admitted with ARF and urinary retention. Pt sleeping. Daughter present and states he was sundowning and did not sleep.  Objective: Vital signs in last 24 hours: Temp:  [97.7 F (36.5 C)-99.2 F (37.3 C)] 97.7 F (36.5 C) (05/28 0426) Pulse Rate:  [60-86] 83 (05/28 0438) Resp:  [18] 18 (05/28 0426) BP: (113-126)/(47-58) 117/58 mmHg (05/28 0426) SpO2:  [85 %-99 %] 99 % (05/28 0438) Weight change:  Last BM Date: 10/27/15  Intake/Output from previous day: 05/27 0701 - 05/28 0700 In: 1767 [P.O.:720; I.V.:1047] Out: 2150 [Urine:2150] Intake/Output this shift:    General appearance: alert, cooperative and no distress Head: Normocephalic, without obvious abnormality, atraumatic Eyes: conjunctivae/corneas clear. PERRL, EOM's intact. Fundi benign. Ears: normal Neck: no JVD and supple, symmetrical, trachea midline Resp: clear to auscultation bilaterally and normal percussion bilaterally Cardio: regular rate and rhythm GI: soft, non-tender; bowel sounds normal; no masses,  no organomegaly Extremities: Left lower ext edema Skin: Skin color, texture, turgor normal. No rashes or lesions Neurologic: Alert and oriented X 3, normal strength and tone. Normal symmetric reflexes. Normal coordination and gait    General: No acute distress. Sleeping HEENT: No JVD CV: RRR 1/6 systolic murmur Lungs: CTA, no dullness to percussion. No use of accessary muscles. GI: Soft nontender nondistended. BS + Ext: Left lower ext edema  Lab Results:  Recent Labs  10/28/15 0423 10/29/15 0431  WBC 16.1* 10.8*  HGB 10.1* 9.9*  HCT 30.7* 30.9*  PLT 153 168   BMET  Recent Labs  10/28/15 0423 10/29/15 0431  NA 138 138  K 4.4 4.7  CL 111 109  CO2 19* 22  GLUCOSE 107* 115*  BUN 72* 64*  CREATININE 2.86* 2.16*  CALCIUM 8.1* 8.3*    Studies/Results: Dg Chest 2 View  10/27/2015  CLINICAL DATA:  Worsening back pain. Shortness of breath. Leukocytosis. EXAM: CHEST  2 VIEW  COMPARISON:  None, including search under other medical record numbers. FINDINGS: There is extensive opacification of right chest with pleural calcification, pleural thickening, and volume loss. The left lung is hyperinflated but clear. No convincing left-sided pleural calcification right-sided opacity could all be chronic, but cannot exclude pneumonia, complex pleural effusion, or malignancy. Normal heart size and mediastinal contours. IMPRESSION: Extensive opacification of the right chest, constellation of findings suggesting fibrothorax. Cannot exclude superimposed acute or malignant pleural parenchymal disease and chest CT is suggested. Electronically Signed   By: Marnee SpringJonathon  Watts M.D.   On: 10/27/2015 10:36   Dg Lumbar Spine Complete  10/27/2015  CLINICAL DATA:  Worsening low back pain and shortness-of-breath today. EXAM: LUMBAR SPINE - COMPLETE 4+ VIEW COMPARISON:  None. FINDINGS: Mild curvature of the lumbar spine convex to the right. Vertebral body heights are within normal. There is moderate spondylosis throughout the lumbar spine to include facet arthropathy. Moderate disc space narrowing at the L2-3, L3-4 L4-5 levels. Significant calcified plaque over the abdominal aorta and iliac arteries. IMPRESSION: Moderate spondylosis of the lumbar spine with significant disc disease from the L2-3 level to the L4-5 level. Mild curvature convex right. Electronically Signed   By: Elberta Fortisaniel  Boyle M.D.   On: 10/27/2015 10:46   [place] Renal  10/28/2015  CLINICAL DATA:  Renal failure EXAM: RENAL / URINARY TRACT ULTRASOUND COMPLETE COMPARISON:  None. FINDINGS: Right Kidney: Length: 12.4 cm. Echogenicity within normal limits. No hydronephrosis visualized. 5.4 x 4 x 5.3 cm anechoic right renal mass consistent with a cyst. 4.6 x 4.5 x 4.4 cm anechoic right renal mass with thin  septations most consistent with a mildly complicated cyst. Left Kidney: Length: 11.7 cm. Echogenicity within normal limits. No hydronephrosis visualized.  Two anechoic left renal masses measuring 2.5 x 3 x 2.7 cm and 4.5 x 4.3 x 5.1 cm respectively. Bladder: Bladder wall thickening likely secondary to underdistention with a Foley catheter present. IMPRESSION: 1. No obstructive uropathy. 2. Multiple bilateral renal cysts. 3. Mildly complicated right renal cyst with thin internal septations. Electronically Signed   By: Elige Ko   On: 10/28/2015 12:53   US Venous Img Lower Unilateral Left  10/27/2015  CLINICAL DATA:  Left leg swelling. EXAM: LEFT LOWER EXTREMITY VENOUS DOPPLER ULTRASOUND TECHNIQUE: Gray-scale sonography with graded compression, as well as color Doppler and duplex ultrasound were performed to evaluate the lower extremity deep venous systems from the level of the common femoral vein and including the common femoral, femoral, profunda femoral, popliteal and calf veins including the posterior tibial, peroneal and gastrocnemius veins when visible. The superficial great saphenous vein was also interrogated. Spectral Doppler was utilized to evaluate flow at rest and with distal augmentation maneuvers in the common femoral, femoral and popliteal veins. COMPARISON:  None. FINDINGS: Contralateral Common Femoral Vein: Occlusive thrombus noted within the right common femoral vein. Common Femoral Vein: Occlusive thrombus noted. Saphenofemoral Junction: Occlusive thrombus noted. Profunda Femoral Vein: Occlusive thrombus noted. Femoral Vein: Occlusive thrombus noted. Popliteal Vein: A occlusive thrombus noted. Calf Veins: Occlusive thrombus noted. Superficial Great Saphenous Vein: Non a well visualized. Venous Reflux:  None. Other Findings:  None. IMPRESSION: Extensive occlusive thrombus throughout the left lower extremity venous system. Occlusive thrombus is also noted in the contralateral right common femoral vein. These results will be called to the ordering clinician or representative by the Radiologist Assistant, and communication documented in the PACS or  [facility] Dashboard. Electronically Signed   By: Charlett Nose M.D.   On: 10/27/2015 10:16    Medications:  Scheduled: . acetaminophen  650 mg Oral QHS  . amLODipine  5 mg Oral Daily  . cefTRIAXone (ROCEPHIN)  IV  1 g Intravenous Q24H  . dorzolamide  1 drop Both Eyes BID  . lamoTRIgine  25 mg Oral QHS  . latanoprost  1 drop Both Eyes QHS  . LORazepam  0.25 mg Intravenous QHS,MR X 1  . memantine  10 mg Oral BID  . metoprolol tartrate  12.5 mg Oral BID  . multivitamin with minerals  1 tablet Oral Daily  . omega-3 acid ethyl esters  1 g Oral Daily  . sodium chloride flush  3 mL Intravenous Q12H  . tamsulosin  0.4 mg Oral Daily  . zinc sulfate  220 mg Oral BID   Continuous: . sodium chloride 75 mL/hr at 10/28/15 1629  . heparin 1,200 Units/hr (10/29/15 0044)    Assessment/Plan: 1. Acute Renal Failure: Secondary to urinary retention and dehydration. Cr continues to improve with IV hydration and relief of urinary retention.. Renal dose meds for now.  2. Urinary Retention: 3 liters diuresed upon foley placement. Urology consulted. Will probably need foley for several weeks. Still having mild hematuria.  3. UTI: On abx. Culture neg so far.  4. Elevated Trop: Likely secondary to ARF. However, second trop slightly more elevated. Will go ahead and cycle two more troponins now that renal function better.Troponin trended about the same overnight. Started B-blocker. Cardiology to see patient today. Suspect demand ischemia.  5. Bilateral Lower Ext DVT: On IV heparin. Left leg tightly swollen. Evaluated yesterday for possible IVC filter. Will not  proceed with IVC placement unless does not tolerate anticoagulation. Change to oral anticoagulation in next day or two.  6. Sundowning: Had low dose ativan last night. May need to increase dose.  Time spent 35 min   LOS: 2 days   Gracelyn Nurse 10/29/2015, 9:41 AM

## 2015-10-29 NOTE — [place] FL2 (Signed)
MEDICAID FL2 LEVEL OF CARE SCREENING TOOL     IDENTIFICATION  Patient Name: Maurice Carney Birthdate: 1917/02/02 Sex: male Admission Date (Current Location): 10/27/2015  [place] and [place] Number:  Chiropodist and Address:  [facility], [address], [place], [place] [address]      Provider Number: [id number]  Attending Physician Name and Address:  Gracelyn Nurse, MD  Relative Name and Phone Number:       Current Level of Care: Hospital Recommended Level of Care: SNF  Prior Approval Number:    Date Approved/Denied:   PASRR Number:   [id number]   Discharge Plan: SNF    Current Diagnoses: Patient Active Problem List   Diagnosis Date Noted  . Demand ischemia (HCC) 10/29/2015  . Essential hypertension 10/29/2015  . Hyperlipidemia 10/29/2015  . PVC (premature ventricular contraction) 10/29/2015  . Moderate aortic stenosis 10/29/2015  . Acute bilateral deep vein thrombosis (DVT) of femoral veins (HCC)   . Chronic anticoagulation   . Renal failure 10/27/2015    Orientation RESPIRATION BLADDER Height & Weight     Self, Time, Situation, Place  Oxygen  Indwelling catheter Weight: 175 lb (79.379 kg) Height:   (193 cm)  BEHAVIORAL SYMPTOMS/MOOD NEUROLOGICAL BOWEL NUTRITION STATUS      Incontinent Diet (normal)  AMBULATORY STATUS COMMUNICATION OF NEEDS Skin   Extensive Assist Verbally Normal                       Personal Care Assistance Level of Assistance   Bathing Assistance: Maximum assistance Feeding assistance: Limited assistance Dressing Assistance: Maximum assistance Total Care Assistance: Maximum assistance   Functional Limitations Info  Sight, Hearing, Speech Sight Info: Impaired Hearing Info: Adequate Speech Info: Adequate    SPECIAL CARE FACTORS FREQUENCY   PT and OT   5 times per week                   Contractures      Additional Factors Info  Allergies   Allergies Info:  Sulpha antibiotics           Current Medications (10/29/2015):  This is the current hospital active medication list Current Facility-Administered Medications  Medication Dose Route Frequency Provider Last Rate Last Dose  . 0.9 %  sodium chloride infusion   Intravenous Continuous Adrian Saran, MD 75 mL/hr at 10/28/15 1629    . acetaminophen (TYLENOL) tablet 650 mg  650 mg Oral Q6H PRN Adrian Saran, MD       Or  . acetaminophen (TYLENOL) suppository 650 mg  650 mg Rectal Q6H PRN Adrian Saran, MD      . acetaminophen (TYLENOL) tablet 650 mg  650 mg Oral QHS Adrian Saran, MD   650 mg at 10/28/15 2254  . amLODipine (NORVASC) tablet 5 mg  5 mg Oral Daily Adrian Saran, MD   5 mg at 10/29/15 1110  . ciprofloxacin (CIPRO) tablet 250 mg  250 mg Oral Q18H Gracelyn Nurse, MD   250 mg at 10/29/15 1251  . dorzolamide (TRUSOPT) 2 % ophthalmic solution 1 drop  1 drop Both Eyes BID Adrian Saran, MD   1 drop at 10/29/15 1110  . heparin ADULT infusion 100 units/mL (25000 units/249mL sodium chloride 0.45%)  1,200 Units/hr Intravenous Continuous Adrian Saran, MD 12 mL/hr at 10/29/15 0044 1,200 Units/hr at 10/29/15 0044  . lamoTRIgine (LAMICTAL) tablet 25 mg  25 mg Oral QHS Adrian Saran, MD   25  mg at 10/28/15 2254  . latanoprost (XALATAN) 0.005 % ophthalmic solution 1 drop  1 drop Both Eyes QHS Adrian SaranSital Mody, MD   1 drop at 10/27/15 2247  . LORazepam (ATIVAN) injection 0.5 mg  0.5 mg Intravenous QHS,MR X 1 Gracelyn NurseJohn D Johnston, MD      . memantine [facility](NAMENDA) tablet 10 mg  10 mg Oral BID Adrian SaranSital Mody, MD   10 mg at 10/29/15 1110  . metoprolol tartrate (LOPRESSOR) tablet 12.5 mg  12.5 mg Oral BID Gracelyn NurseJohn D Johnston, MD   12.5 mg at 10/29/15 1110  . multivitamin with minerals tablet 1 tablet  1 tablet Oral Daily Adrian SaranSital Mody, MD   1 tablet at 10/29/15 1110  . omega-3 acid ethyl esters (LOVAZA) capsule 1 g  1 g Oral Daily Adrian SaranSital Mody, MD   1 g at 10/29/15 1109  . ondansetron (ZOFRAN) tablet 4 mg  4 mg Oral Q6H PRN Adrian SaranSital Mody, MD       Or  .  ondansetron (ZOFRAN) injection 4 mg  4 mg Intravenous Q6H PRN Sital Mody, MD      . polyethylene glycol (MIRALAX / GLYCOLAX) packet 17 g  17 g Oral Daily PRN Adrian SaranSital Mody, MD      . senna-docusate (Senokot-S) tablet 1 tablet  1 tablet Oral QHS PRN Adrian SaranSital Mody, MD      . sodium chloride flush (NS) 0.9 % injection 3 mL  3 mL Intravenous Q12H Adrian SaranSital Mody, MD   3 mL at 10/28/15 2255  . tamsulosin (FLOMAX) capsule 0.4 mg  0.4 mg Oral Daily Sital Mody, MD   0.4 mg at 10/29/15 1110  . zinc sulfate capsule 220 mg  220 mg Oral BID Adrian SaranSital Mody, MD   220 mg at 10/29/15 1110     Discharge Medications: Please see discharge summary for a list of discharge medications.  Relevant Imaging Results:  Relevant Lab Results:   Additional Information [id number]  Haig ProphetMorgan, Amen Dargis G, LCSW

## 2015-10-29 NOTE — Clinical Social Work Placement (Signed)
   CLINICAL SOCIAL WORK PLACEMENT  NOTE  Date:  10/29/2015  Patient Details  Name: Maurice Carney MRN: [id number] Date of Birth: 03-27-1917  Clinical Social Work is seeking post-discharge placement for this patient at the Skilled  Nursing Facility level of care (*CSW will initial, date and re-position this form in  chart as items are completed):  Yes   Patient/family provided with [facility] Clinical Social Work Department's list of facilities offering this level of care within the geographic area requested by the patient (or if unable, by the patient's family).  Yes   Patient/family informed of their freedom to choose among providers that offer the needed level of care, that participate in Medicare, Medicaid or managed care program needed by the patient, have an available bed and are willing to accept the patient.  Yes   Patient/family informed of [facility]'s ownership interest in [facility] and [facility], as well as of the fact that they are under no obligation to receive care at these facilities.  PASRR submitted to EDS on 10/28/15     PASRR number received on 10/28/15     Existing PASRR number confirmed on       FL2 transmitted to all facilities in geographic area requested by pt/family on 10/29/15     FL2 transmitted to all facilities within larger geographic area on       Patient informed that his/her managed care company has contracts with or will negotiate with certain facilities, including the following:            Patient/family informed of bed offers received.  Patient chooses bed at       Physician recommends and patient chooses bed at      Patient to be transferred to   on  .  Patient to be transferred to facility by       Patient family notified on   of transfer.  Name of family member notified:        PHYSICIAN       Additional Comment:    _______________________________________________ Haig ProphetMorgan, Jorgina Binning G, LCSW 10/29/2015, 2:48 PM

## 2015-10-29 NOTE — Progress Notes (Signed)
Dr. Letitia LibraJohnston rounding on patient again, orders to increase fluids back to 3975ml/hr since symptoms have improved and cxr looked WDL compared to previous one, concerned about kidneys regressing and worsening with decreased rate of infusion. Will adjust rate and continue to monitor.

## 2015-10-29 NOTE — Progress Notes (Signed)
PT is recommending SNF. Clinical Social Worker (CSW) met with patient and his daughter Arbie Cookey was at bedside. Per daughter patient lives with his wife at [facility]. Per Arbie Cookey she is an Therapist, sports for the city of [place]. CSW explained that PT is recommending a higher level of care. Daughter is agreeable to SNF search and prefers [facility] or [facility]. FL2 complete and faxed out. CSW will continue to follow and assist as needed.   Blima Rich, LCSW [phone]

## 2015-10-30 ENCOUNTER — Inpatient Hospital Stay (HOSPITAL_COMMUNITY)
Admit: 2015-10-30 | Discharge: 2015-10-30 | Disposition: A | Discharge status: Home / Self Care | Payer: Medicare Other | Attending: Cardiovascular Disease | Admitting: Cardiovascular Disease

## 2015-10-30 DIAGNOSIS — R011 Cardiac murmur, unspecified: Secondary | ICD-10-CM

## 2015-10-30 LAB — ECHOCARDIOGRAM LIMITED
Height: 76 in
Weight: 2800 oz

## 2015-10-30 LAB — CBC
HEMATOCRIT: 28.9 % — AB (ref 40.0–52.0)
HEMOGLOBIN: 9.4 g/dL — AB (ref 13.0–18.0)
MCH: 28.2 pg (ref 26.0–34.0)
MCHC: 32.5 g/dL (ref 32.0–36.0)
MCV: 86.9 fL (ref 80.0–100.0)
Platelets: 239 10*3/uL (ref 150–440)
RBC: 3.32 MIL/uL — AB (ref 4.40–5.90)
RDW: 14.5 % (ref 11.5–14.5)
WBC: 17.6 10*3/uL — ABNORMAL HIGH (ref 3.8–10.6)

## 2015-10-30 LAB — BASIC METABOLIC PANEL
Anion gap: 5 (ref 5–15)
BUN: 55 mg/dL — ABNORMAL HIGH (ref 6–20)
CHLORIDE: 114 mmol/L — AB (ref 101–111)
CO2: 22 mmol/L (ref 22–32)
Calcium: 8.2 mg/dL — ABNORMAL LOW (ref 8.9–10.3)
Creatinine, Ser: 1.89 mg/dL — ABNORMAL HIGH (ref 0.61–1.24)
GFR calc non Af Amer: 28 mL/min — ABNORMAL LOW (ref >60)
GFR, EST AFRICAN AMERICAN: 33 mL/min — AB (ref >60)
Glucose, Bld: 129 mg/dL — ABNORMAL HIGH (ref 65–99)
POTASSIUM: 4.5 mmol/L (ref 3.5–5.1)
SODIUM: 141 mmol/L (ref 135–145)

## 2015-10-30 LAB — HEPARIN LEVEL (UNFRACTIONATED): HEPARIN UNFRACTIONATED: 0.34 [IU]/mL (ref 0.30–0.70)

## 2015-10-30 MED ORDER — APIXABAN 5 MG PO TABS
10.0000 mg | ORAL_TABLET | Freq: Two times a day (BID) | ORAL | Status: DC
  Administered 2015-10-30 – 2015-11-02 (×6): 10 mg via ORAL
  Filled 2015-10-30 (×6): qty 2

## 2015-10-30 MED ORDER — APIXABAN 5 MG PO TABS: 5.0000 mg | ORAL_TABLET | Freq: Two times a day (BID) | ORAL | Status: DC

## 2015-10-30 NOTE — Progress Notes (Signed)
ANTICOAGULATION CONSULT NOTE   Pharmacy Consult for Apixaban Indication: VTE prophylaxis/DVT treatment  Allergies  Allergen Reactions  . Sulfa Antibiotics     Patient Measurements: Height:  (193 cm) Weight: 175 lb (79.379 kg) IBW/kg (Calculated) : 86.8 Heparin Dosing Weight: 79 kg  Vital Signs: Temp: 98.2 F (36.8 C) (05/29 1216) Temp Source: Oral (05/29 1216) BP: 124/62 mmHg (05/29 1216) Pulse Rate: 70 (05/29 0459)  Labs:  Recent Labs  10/27/15 1618  10/28/15 0423 10/28/15 1151  10/28/15 2223 10/29/15 0431 10/29/15 1056 10/30/15 0504  HGB 11.5*  < > 10.1*  --   --   --  9.9*  --  9.4*  HCT  --   --  30.7*  --   --   --  30.9*  --  28.9*  PLT 152  --  153  --   --   --  168  --  239  APTT >160*  --   --   --   --   --   --   --   --   LABPROT 17.4*  --   --   --   --   --   --   --   --   INR 1.42  --   --   --   --   --   --   --   --   HEPARINUNFRC  --   < > 0.50 0.39  --   --  0.47  --  0.34  CREATININE  --   --  2.86*  --   --   --  2.16*  --  1.89*  TROPONINI 0.08*  < >  --   --   < > 2.82* 3.49* 3.21*  --   < > = values in this interval not displayed.  Estimated Creatinine Clearance: 25.1 mL/min (by C-G formula based on Cr of 1.89).   Medical History: Past Medical History  Diagnosis Date  . Hypertension   . Hyperlipidemia   . Alzheimer disease   . MDD (major depressive disorder) (HCC)   . CKD (chronic kidney disease)   . Demand ischemia (HCC) 10/29/2015  . Essential hypertension 10/29/2015  . PVC (premature ventricular contraction) 10/29/2015  . Moderate aortic stenosis 10/29/2015    Medications:  Scheduled:  . acetaminophen  650 mg Oral QHS  . amLODipine  5 mg Oral Daily  . apixaban  10 mg Oral BID   Followed by  . [START ON 11/06/2015] apixaban  5 mg Oral BID  . ciprofloxacin  250 mg Oral Q18H  . dorzolamide  1 drop Both Eyes BID  . ipratropium-albuterol  3 mL Nebulization Q6H  . lamoTRIgine  25 mg Oral QHS  . latanoprost  1 drop Both  Eyes QHS  . LORazepam  0.5 mg Intravenous QHS,MR X 1  . memantine  10 mg Oral BID  . metoprolol tartrate  12.5 mg Oral BID  . multivitamin with minerals  1 tablet Oral Daily  . omega-3 acid ethyl esters  1 g Oral Daily  . sodium chloride flush  3 mL Intravenous Q12H  . tamsulosin  0.4 mg Oral Daily  . zinc sulfate  220 mg Oral BID   Infusions:  . sodium chloride 75 mL/hr at 10/30/15 0950  . heparin 1,200 Units/hr (10/29/15 2102)    Assessment: 82 yo male with bilateral DVT's with Extensive thrombus up to the common femoral veins bilaterally. Patient to transition from Heparin drip to  Apixaban    Plan:  Will begin apixaban 10mg  po bid x 7 days then 5 mg po bid. Will discontinue heparin drip at time that apixaban is given.  Abdullah Rizzi A, Pharm.D., BCPS Clinical Pharmacist  10/30/2015,3:24 PM

## 2015-10-30 NOTE — Progress Notes (Signed)
CSW met with patient and his daughter at bedside. Presented bed offers. Accepted bed at [facility]. CSW informed Broadus John- Admissions Coordinator at [facility] that patient and his family accepted bed. CSW will continue to follow and assist.   Ernest Pine, MSW, [place] [facility] Work Department [phone]

## 2015-10-30 NOTE — Progress Notes (Signed)
Patient c/o lower leg pain. Nurse gave patient tylenol  2 hours ago. Dr. Shanon RosserPage. Dr. Sheryle Hailiamond order morphine 1 mg. will continue to monitor.

## 2015-10-30 NOTE — Progress Notes (Signed)
Subjective: Patient admitted with ARF and urinary retention. No complaints today  Objective: Vital signs in last 24 hours: Temp:  [97.9 F (36.6 C)-98.5 F (36.9 C)] 98.2 F (36.8 C) (05/29 1216) Pulse Rate:  [70-78] 70 (05/29 0459) Resp:  [16-18] 17 (05/29 1216) BP: (120-155)/(51-62) 124/62 mmHg (05/29 1216) SpO2:  [90 %-95 %] 91 % (05/29 1216) Weight change:  Last BM Date: 10/27/15  Intake/Output from previous day: 05/28 0701 - 05/29 0700 In: 120 [P.O.:120] Out: 2500 [Urine:2500] Intake/Output this shift: Total I/O In: 360 [P.O.:360] Out: 750 [Urine:750]  General appearance: alert, cooperative and no distress Neck: no JVD and supple, symmetrical, trachea midline Resp: clear to auscultation bilaterally and normal percussion bilaterally Cardio: regular rate and rhythm GI: soft, non-tender; bowel sounds normal; no masses,  no organomegaly Extremities: Left lower ext edema, mild right lower ext edema Skin: Skin color, texture, turgor normal. No rashes or lesions Neurologic: Grossly normal    General: No acute distress. Sleeping HEENT: No JVD CV: RRR 1/6 systolic murmur Lungs: CTA, no dullness to percussion. No use of accessary muscles. GI: Soft nontender nondistended. 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 Chronic changes in the right hemi thorax are again noted and stable. The left lung remains clear. No sizable effusion or focal infiltrate is noted. No acute  bony abnormality is seen. IMPRESSION: Chronic changes in the right hemi thorax stable from the prior exam. Electronically Signed   By: Alcide CleverMark  Lukens M.D.   On: 10/29/2015 15:19    Medications:  Scheduled: . acetaminophen  650 mg Oral QHS  . amLODipine  5 mg Oral Daily  . apixaban  10 mg Oral BID   Followed by  . [START ON 11/06/2015] apixaban  5 mg Oral BID  . ciprofloxacin  250 mg Oral Q18H  . dorzolamide  1 drop Both Eyes BID  . ipratropium-albuterol  3 mL Nebulization Q6H  . lamoTRIgine  25 mg Oral QHS  . latanoprost  1 drop Both Eyes QHS  . LORazepam  0.5 mg Intravenous QHS,MR X 1  . memantine  10 mg Oral BID  . metoprolol tartrate  12.5 mg Oral BID  . multivitamin with minerals  1 tablet Oral Daily  . omega-3 acid ethyl esters  1 g Oral Daily  . sodium chloride flush  3 mL Intravenous Q12H  . tamsulosin  0.4 mg Oral Daily  . zinc sulfate  220 mg Oral BID   Continuous: . sodium chloride 50 mL/hr at 10/30/15 1603  . heparin 1,200 Units/hr (10/29/15 2102)    Assessment/Plan: 1. Acute Renal Failure: Secondary to urinary retention and dehydration. Cr continues to improve with IV hydration.   2. Urinary Retention: 3 liters diuresed upon foley placement. Urology consulted. Will probably need foley for several weeks. Hematuria has resolved..  3. UTI: On abx. Growing e. Coli. On cipro..  4. Elevated  Trop: Likely secondary to ARF ad demand ischemia. Echo has slight decreased EF but no wall motion abnormality. Cardiology evaluated and agree with current medical care. No need for further cardiology work up.  5. Bilateral Lower Ext DVT: On IV heparin. Left leg tightly swollen. Evaluated yesterday for possible IVC filter. Will not proceed with IVC placement unless does not tolerate anticoagulation. Changing to PO Eliquis today. Possible he could have PE also but would not change treatment plan at this point. Since tolerating anticoagulation there is no indication for IVC filter.   6.  Questionable Aspiration: Having swallowing eval.  6. Sundowning: Had low dose ativan last night. May need to increase dose.  Time spent 35 min   LOS: 3 days   Gracelyn Nurse 10/30/2015, 4:26 PM

## 2015-10-30 NOTE — Progress Notes (Signed)
Patient: Maurice Carney / Admit Date: 10/27/2015 / Date of Encounter: 10/30/2015, 1:52 PM   Subjective: Resting comfortably this morning, was awake overnight Long discussion with patient's daughter and other family at the bedside Lower extremity edema has improved with leg elevation, Lasix Improvement in shortness of breath -Lower extremity ultrasound with large clot burden common femoral veins extending distally bilaterally Improved renal function after Foley placement  Long discussion with daughter concerning patient's high fall risk. She reports 4 falls in the past 6 weeks, one episode he hit his head  Review of Systems: Review of Systems  Unable to perform ROS Patient sleeping  Objective: Telemetry: Normal sinus rhythm Physical Exam: Blood pressure 124/62, pulse 70, temperature 98.2 F (36.8 C), temperature source Oral, resp. rate 17, height 6\' 4"  (1.93 m), weight 175 lb (79.379 kg), SpO2 91 %. Body mass index is 21.31 kg/(m^2). General: Well developed, well nourished, in no acute distress. Head: Normocephalic, atraumatic, sclera non-icteric, no xanthomas, nares are without discharge. Neck: Negative for carotid bruits. JVP not elevated. Lungs: Clear bilaterally to auscultation without wheezes, rales, or rhonchi. Breathing is unlabored. Heart: RRR S1 S2 without murmurs, rubs, or gallops.  Abdomen: Soft, non-tender, non-distended with normoactive bowel sounds. No rebound/guarding. Extremities: No clubbing or cyanosis. Trace edema up to the mid shins. Distal pedal pulses are 2+ and equal bilaterally. Neuro: Sleeping, full exam not performed Psych:  Sleeping   Intake/Output Summary (Last 24 hours) at 10/30/15 1352 Last data filed at 10/30/15 1008  Gross per 24 hour  Intake    240 ml  Output   2850 ml  Net  -2610 ml    Inpatient Medications:  . acetaminophen  650 mg Oral QHS  . amLODipine  5 mg Oral Daily  . ciprofloxacin  250 mg Oral Q18H  . dorzolamide  1 drop Both  Eyes BID  . ipratropium-albuterol  3 mL Nebulization Q6H  . lamoTRIgine  25 mg Oral QHS  . latanoprost  1 drop Both Eyes QHS  . LORazepam  0.5 mg Intravenous QHS,MR X 1  . memantine  10 mg Oral BID  . metoprolol tartrate  12.5 mg Oral BID  . multivitamin with minerals  1 tablet Oral Daily  . omega-3 acid ethyl esters  1 g Oral Daily  . sodium chloride flush  3 mL Intravenous Q12H  . tamsulosin  0.4 mg Oral Daily  . zinc sulfate  220 mg Oral BID   Infusions:  . sodium chloride 75 mL/hr at 10/30/15 0950  . heparin 1,200 Units/hr (10/29/15 2102)    Labs:  Recent Labs  10/29/15 0431 10/30/15 0504  NA 138 141  K 4.7 4.5  CL 109 114*  CO2 22 22  GLUCOSE 115* 129*  BUN 64* 55*  CREATININE 2.16* 1.89*  CALCIUM 8.3* 8.2*   No results for input(s): AST, ALT, ALKPHOS, BILITOT, PROT, ALBUMIN in the last 72 hours.  Recent Labs  10/29/15 0431 10/30/15 0504  WBC 10.8* 17.6*  HGB 9.9* 9.4*  HCT 30.9* 28.9*  MCV 88.1 86.9  PLT 168 239    Recent Labs  10/28/15 1703 10/28/15 2223 10/29/15 0431 10/29/15 1056  TROPONINI 3.19* 2.82* 3.49* 3.21*   Invalid input(s): POCBNP No results for input(s): HGBA1C in the last 72 hours.   Weights: Filed Weights   10/27/15 1101  Weight: 175 lb (79.379 kg)     Radiology/Studies:  Dg Chest 2 View  10/27/2015  CLINICAL DATA:  Worsening back pain. Shortness of breath.  Leukocytosis. EXAM: CHEST  2 VIEW COMPARISON:  None, including search under other medical record numbers. FINDINGS: There is extensive opacification of right chest with pleural calcification, pleural thickening, and volume loss. The left lung is hyperinflated but clear. No convincing left-sided pleural calcification right-sided opacity could all be chronic, but cannot exclude pneumonia, complex pleural effusion, or malignancy. Normal heart size and mediastinal contours. IMPRESSION: Extensive opacification of the right chest, constellation of findings suggesting fibrothorax.  Cannot exclude superimposed acute or malignant pleural parenchymal disease and chest CT is suggested. Electronically Signed   By: Marnee Spring M.D.   On: 10/27/2015 10:36   Dg Lumbar Spine Complete  10/27/2015  CLINICAL DATA:  Worsening low back pain and shortness-of-breath today. EXAM: LUMBAR SPINE - COMPLETE 4+ VIEW COMPARISON:  None. FINDINGS: Mild curvature of the lumbar spine convex to the right. Vertebral body heights are within normal. There is moderate spondylosis throughout the lumbar spine to include facet arthropathy. Moderate disc space narrowing at the L2-3, L3-4 L4-5 levels. Significant calcified plaque over the abdominal aorta and iliac arteries. IMPRESSION: Moderate spondylosis of the lumbar spine with significant disc disease from the L2-3 level to the L4-5 level. Mild curvature convex right. Electronically Signed   By: Elberta Fortis M.D.   On: 10/27/2015 10:46   US Renal  10/28/2015  CLINICAL DATA:  Renal failure EXAM: RENAL / URINARY TRACT ULTRASOUND COMPLETE COMPARISON:  None. FINDINGS: Right Kidney: Length: 12.4 cm. Echogenicity within normal limits. No hydronephrosis visualized. 5.4 x 4 x 5.3 cm anechoic right renal mass consistent with a cyst. 4.6 x 4.5 x 4.4 cm anechoic right renal mass with thin septations most consistent with a mildly complicated cyst. Left Kidney: Length: 11.7 cm. Echogenicity within normal limits. No hydronephrosis visualized. Two anechoic left renal masses measuring 2.5 x 3 x 2.7 cm and 4.5 x 4.3 x 5.1 cm respectively. Bladder: Bladder wall thickening likely secondary to underdistention with a Foley catheter present. IMPRESSION: 1. No obstructive uropathy. 2. Multiple bilateral renal cysts. 3. Mildly complicated right renal cyst with thin internal septations. Electronically Signed   By: Elige Ko   On: 10/28/2015 12:53   US Venous Img Lower Unilateral Left  10/27/2015  FINDINGS: Contralateral Common Femoral Vein: Occlusive thrombus noted within the right  common femoral vein. Common Femoral Vein: Occlusive thrombus noted. Saphenofemoral Junction: Occlusive thrombus noted. Profunda Femoral Vein: Occlusive thrombus noted. Femoral Vein: Occlusive thrombus noted. Popliteal Vein: A occlusive thrombus noted. Calf Veins: Occlusive thrombus noted. Superficial Great Saphenous Vein: Non a well visualized. Venous Reflux:  None. Other Findings:  None. IMPRESSION: Extensive occlusive thrombus throughout the left lower extremity venous system. Occlusive thrombus is also noted in the contralateral right common femoral vein. These results will be called to the ordering clinician or representative by the Radiologist Assistant, and communication documented in the PACS or zVision Dashboard. Electronically Signed   By: Charlett Nose M.D.   On: 10/27/2015 10:16   Dg Chest Port 1 View  10/29/2015  CLINICAL DATA:  Renal failure, history of tuberculosis in the right lung EXAM: PORTABLE CHEST 1 VIEW COMPARISON:  10/27/2015 FINDINGS: Cardiac shadow is stable. Chronic changes in the right hemi thorax are again noted and stable. The left lung remains clear. No sizable effusion or focal infiltrate is noted. No acute bony abnormality is seen. IMPRESSION: Chronic changes in the right hemi thorax stable from the prior exam. Electronically Signed   By: Alcide Clever M.D.   On: 10/29/2015 15:19  Assessment and Plan  82 y.o. male   1) bilateral DVT Extensive thrombus up to the common femoral veins bilaterally Daughter reports he is high fall risk, 4 falls in the past 6 weeks, 1 fall he hit his head She is concerned about long-term anticoagulation given his fall risk, and feels he might need IVC filter -Previously seen by Dr. dew who suggested IVC filter if he is a poor candidate for anticoagulation  Unable to see RV on echo, to assist with possible PE diagnosis  No VQ scan, Unable to perform CT chest with contrast secondary to renal dysfunction -High risk of PE even extensive  thrombus burden. Could consider IVC filter given heavy thrombus burden  --Could transition to a NOAC, though may benefit from being monitored as an inpatient during this transition  2) elevated troponin Etiology not clear at this time, unable to see the right ventricle on echo Unable to exclude PE. EF is essentially normal on echo, though unable to exclude underlying coronary disease, demand ischemia in the setting of urine retention -Long discussion with family, no further ischemia workup at this time  3) urine retention Traumatic Foley placement with hematuria Hematuria has improved today, tolerating heparin Foley still in place outpatient follow-up with urology  4) aortic valve stenosis Noted on echocardiogram, monitor as an outpatient  5) hyperlipidemia Daughter has indicated she would like to stop the simvastatin for now She would like less medications overall We'll stop the simvastatin at her request Consider restarting at a later date   Total encounter time more than 35minutes  Greater than 50% was spent in counseling and coordination of care with the patient   Signed, Dossie Arbourim Farhan Jean, MD, Ph.D. [facility] 10/30/2015, 1:52 PM

## 2015-10-30 NOTE — Progress Notes (Signed)
[facility]  ROUNDING NOTE   Subjective:  Patient resting comfortably in bed. Daughter remains at the bedside. Creatinine down to 1.89. Still having some periods of sundowning.   Objective:  Vital signs in last 24 hours:  Temp:  [97.9 F (36.6 C)-98.5 F (36.9 C)] 98.2 F (36.8 C) (05/29 1216) Pulse Rate:  [70-78] 70 (05/29 0459) Resp:  [16-18] 17 (05/29 1216) BP: (120-155)/(51-62) 124/62 mmHg (05/29 1216) SpO2:  [90 %-95 %] 91 % (05/29 1216)  Weight change:  Filed Weights   10/27/15 1101  Weight: 79.379 kg (175 lb)    Intake/Output: I/O last 3 completed shifts: In: 1167 [P.O.:120; I.V.:1047] Out: 3400 [Urine:3400]   Intake/Output this shift:  Total I/O In: 360 [P.O.:360] Out: 350 [Urine:350]  Physical Exam: General: NAD, resting in bed  Head: Normocephalic, atraumatic. Moist oral mucosal membranes  Eyes: Anicteric  Neck: Supple, trachea midline  Lungs:  Clear to auscultation  Heart: S1S2 no rubs  Abdomen:  Soft, nontender, BS present   Extremities: L>R peripheral edema.  Neurologic: Nonfocal, moving all four extremities  Skin: No lesions       Basic Metabolic Panel:  Recent Labs Lab 10/27/15 0925 10/28/15 0423 10/29/15 0431 10/30/15 0504  NA 139 138 138 141  K 4.9 4.4 4.7 4.5  CL 108 111 109 114*  CO2 20* 19* 22 22  GLUCOSE 119* 107* 115* 129*  BUN 91* 72* 64* 55*  CREATININE 4.41* 2.86* 2.16* 1.89*  CALCIUM 8.9 8.1* 8.3* 8.2*    Liver Function Tests: No results for input(s): AST, ALT, ALKPHOS, BILITOT, PROT, ALBUMIN in the last 168 hours. No results for input(s): LIPASE, AMYLASE in the last 168 hours. No results for input(s): AMMONIA in the last 168 hours.  CBC:  Recent Labs Lab 10/27/15 0925 10/27/15 1618 10/27/15 1830 10/28/15 0423 10/29/15 0431 10/30/15 0504  WBC 15.6*  --   --  16.1* 10.8* 17.6*  NEUTROABS 13.0*  --   --   --   --   --   HGB 11.7* 11.5* 12.3* 10.1* 9.9* 9.4*  HCT 34.8*  --   --  30.7* 30.9*  28.9*  MCV 86.4  --   --  87.0 88.1 86.9  PLT 159 152  --  153 168 239    Cardiac Enzymes:  Recent Labs Lab 10/27/15 2300 10/28/15 1703 10/28/15 2223 10/29/15 0431 10/29/15 1056  TROPONINI 0.69* 3.19* 2.82* 3.49* 3.21*    BNP: Invalid input(s): POCBNP  CBG: No results for input(s): GLUCAP in the last 168 hours.  Microbiology: Results for orders placed or performed during the hospital encounter of 10/27/15  Blood culture (routine x 2)     Status: None (Preliminary result)   Collection Time: 10/27/15  9:25 AM  Result Value Ref Range Status   Specimen Description BLOOD LEFT FOREARM  Final   Special Requests BOTTLES DRAWN AEROBIC AND ANAEROBIC  10CC  Final   Culture NO GROWTH 3 DAYS  Final   Report Status PENDING  Incomplete  Blood culture (routine x 2)     Status: None (Preliminary result)   Collection Time: 10/27/15  9:25 AM  Result Value Ref Range Status   Specimen Description BLOOD LEFT ASSIST CONTROL  Final   Special Requests BOTTLES DRAWN AEROBIC AND ANAEROBIC  7CC  Final   Culture NO GROWTH 3 DAYS  Final   Report Status PENDING  Incomplete  Urine culture     Status: Abnormal   Collection Time: 10/27/15  9:25 AM  Result Value Ref Range Status   Specimen Description URINE, RANDOM  Final   Special Requests NONE  Final   Culture (A)  Final    >=100,000 COLONIES/mL STAPHYLOCOCCUS SPECIES (COAGULASE NEGATIVE)   Report Status 10/29/2015 FINAL  Final   Organism ID, Bacteria STAPHYLOCOCCUS SPECIES (COAGULASE NEGATIVE) (A)  Final      Susceptibility   Staphylococcus species (coagulase negative) - MIC*    CIPROFLOXACIN 2 INTERMEDIATE Intermediate     GENTAMICIN <=0.5 SENSITIVE Sensitive     NITROFURANTOIN <=16 SENSITIVE Sensitive     OXACILLIN Value in next row Resistant      >=4 RESISTANTWARNING: For oxacillin-resistant S.aureus and coagulase-negative staphylococci (MRS), other beta-lactam agents, ie, penicillins, beta-lactam/beta-lactamase inhibitor combinations,  cephems (with the exception of the cephalosporins with anti-MRSA activity), and carbapenems, may appear active in vitro, but are not effective clinically.  --CLSI, Vol.32 No.3, January 2012, pg 70.    TETRACYCLINE Value in next row Sensitive      >=4 RESISTANTWARNING: For oxacillin-resistant S.aureus and coagulase-negative staphylococci (MRS), other beta-lactam agents, ie, penicillins, beta-lactam/beta-lactamase inhibitor combinations, cephems (with the exception of the cephalosporins with anti-MRSA activity), and carbapenems, may appear active in vitro, but are not effective clinically.  --CLSI, Vol.32 No.3, January 2012, pg 70.    VANCOMYCIN Value in next row Sensitive      >=4 RESISTANTWARNING: For oxacillin-resistant S.aureus and coagulase-negative staphylococci (MRS), other beta-lactam agents, ie, penicillins, beta-lactam/beta-lactamase inhibitor combinations, cephems (with the exception of the cephalosporins with anti-MRSA activity), and carbapenems, may appear active in vitro, but are not effective clinically.  --CLSI, Vol.32 No.3, January 2012, pg 70.    TRIMETH/SULFA Value in next row Sensitive      >=4 RESISTANTWARNING: For oxacillin-resistant S.aureus and coagulase-negative staphylococci (MRS), other beta-lactam agents, ie, penicillins, beta-lactam/beta-lactamase inhibitor combinations, cephems (with the exception of the cephalosporins with anti-MRSA activity), and carbapenems, may appear active in vitro, but are not effective clinically.  --CLSI, Vol.32 No.3, January 2012, pg 70.    CLINDAMYCIN Value in next row Sensitive      >=4 RESISTANTWARNING: For oxacillin-resistant S.aureus and coagulase-negative staphylococci (MRS), other beta-lactam agents, ie, penicillins, beta-lactam/beta-lactamase inhibitor combinations, cephems (with the exception of the cephalosporins with anti-MRSA activity), and carbapenems, may appear active in vitro, but are not effective clinically.  --CLSI, Vol.32 No.3,  January 2012, pg 70.    RIFAMPIN Value in next row Sensitive      >=4 RESISTANTWARNING: For oxacillin-resistant S.aureus and coagulase-negative staphylococci (MRS), other beta-lactam agents, ie, penicillins, beta-lactam/beta-lactamase inhibitor combinations, cephems (with the exception of the cephalosporins with anti-MRSA activity), and carbapenems, may appear active in vitro, but are not effective clinically.  --CLSI, Vol.32 No.3, January 2012, pg 70.    * >=100,000 COLONIES/mL STAPHYLOCOCCUS SPECIES (COAGULASE NEGATIVE)  MRSA PCR Screening     Status: None   Collection Time: 10/27/15  5:14 PM  Result Value Ref Range Status   MRSA by PCR NEGATIVE NEGATIVE Final    Comment:        The GeneXpert MRSA Assay (FDA approved for NASAL specimens only), is one component of a comprehensive MRSA colonization surveillance program. It is not intended to diagnose MRSA infection nor to guide or monitor treatment for MRSA infections.     Coagulation Studies:  Recent Labs  10/27/15 1618  LABPROT 17.4*  INR 1.42    Urinalysis: No results for input(s): COLORURINE, LABSPEC, PHURINE, GLUCOSEU, HGBUR, BILIRUBINUR, KETONESUR, PROTEINUR, UROBILINOGEN, NITRITE, LEUKOCYTESUR in the last 72 hours.  Invalid input(s): APPERANCEUR  Imaging: Dg Chest Port 1 View  10/29/2015  CLINICAL DATA:  Renal failure, history of tuberculosis in the right lung EXAM: PORTABLE CHEST 1 VIEW COMPARISON:  10/27/2015 FINDINGS: Cardiac shadow is stable. Chronic changes in the right hemi thorax are again noted and stable. The left lung remains clear. No sizable effusion or focal infiltrate is noted. No acute bony abnormality is seen. IMPRESSION: Chronic changes in the right hemi thorax stable from the prior exam. Electronically Signed   By: Alcide Clever M.D.   On: 10/29/2015 15:19     Medications:   . sodium chloride 75 mL/hr at 10/30/15 0950  . heparin 1,200 Units/hr (10/29/15 2102)   . acetaminophen  650 mg Oral QHS   . amLODipine  5 mg Oral Daily  . apixaban  10 mg Oral BID   Followed by  . [START ON 11/06/2015] apixaban  5 mg Oral BID  . ciprofloxacin  250 mg Oral Q18H  . dorzolamide  1 drop Both Eyes BID  . ipratropium-albuterol  3 mL Nebulization Q6H  . lamoTRIgine  25 mg Oral QHS  . latanoprost  1 drop Both Eyes QHS  . LORazepam  0.5 mg Intravenous QHS,MR X 1  . memantine  10 mg Oral BID  . metoprolol tartrate  12.5 mg Oral BID  . multivitamin with minerals  1 tablet Oral Daily  . omega-3 acid ethyl esters  1 g Oral Daily  . sodium chloride flush  3 mL Intravenous Q12H  . tamsulosin  0.4 mg Oral Daily  . zinc sulfate  220 mg Oral BID   acetaminophen **OR** acetaminophen, morphine injection, ondansetron **OR** ondansetron (ZOFRAN) IV, polyethylene glycol, senna-docusate  Assessment/ Plan:  82 y.o. male with a PMHx of Hypertension, hyperlipidemia, Alzheimer's disease, depression, chronic kidney disease stage III, who was admitted to [facility] on 10/27/2015 for evaluation of bilateral lower extremity edema left greater than right, found to have acute DVT.   1. Acute renal failure/CKD stage III. Baseline creatinine reported to [place] as being 1.5. Presenting creatinine was above 4. Acute renal failure likely related to urinary retention as well as recent furosemide use. -  Creatinine currently down to 1.89. He may be having some aspiration per nursing report. We will continue IV fluids but we will decrease the rate to 50 cc per hour.  2. Urinary retention with subsequent gross hematuria/history of uretheral stricture/Complicated right renal cyst. Patient with difficult Foley placement. He apparently has history of urethral stricture. . Incidentally he was also found to have a complicated right renal cyst with thin septations.  - Foley catheter continues to be in place. Good urine output of 2.5 L. Continue to monitor urine output.  3. Anemia of chronic kidney disease. Hemoglobin down slightly to 9.4. No  urgent indication for Epogen but will need continued monitoring of CBC.   LOS: 3 Aribella Vavra 5/29/20173:44 PM

## 2015-10-30 NOTE — Progress Notes (Signed)
ANTICOAGULATION CONSULT NOTE   Pharmacy Consult for Heparin Drip Indication: VTE prophylaxis  Allergies  Allergen Reactions  . Sulfa Antibiotics     Patient Measurements: Height: 6\' 4"  (193 cm) Weight: 175 lb (79.379 kg) IBW/kg (Calculated) : 86.8 Heparin Dosing Weight: 79 kg  Vital Signs: Temp: 97.9 F (36.6 C) (05/29 0459) Temp Source: Oral (05/29 0459) BP: 120/51 mmHg (05/29 0459) Pulse Rate: 70 (05/29 0459)  Labs:  Recent Labs  10/27/15 1618  10/28/15 0423 10/28/15 1151  10/28/15 2223 10/29/15 0431 10/29/15 1056 10/30/15 0504  HGB 11.5*  < > 10.1*  --   --   --  9.9*  --  9.4*  HCT  --   --  30.7*  --   --   --  30.9*  --  28.9*  PLT 152  --  153  --   --   --  168  --  239  APTT >160*  --   --   --   --   --   --   --   --   LABPROT 17.4*  --   --   --   --   --   --   --   --   INR 1.42  --   --   --   --   --   --   --   --   HEPARINUNFRC  --   < > 0.50 0.39  --   --  0.47  --  0.34  CREATININE  --   --  2.86*  --   --   --  2.16*  --  1.89*  TROPONINI 0.08*  < >  --   --   < > 2.82* 3.49* 3.21*  --   < > = values in this interval not displayed.  Estimated Creatinine Clearance: 25.1 mL/min (by C-G formula based on Cr of 1.89).   Medical History: Past Medical History  Diagnosis Date  . Hypertension   . Hyperlipidemia   . Alzheimer disease   . MDD (major depressive disorder) (HCC)   . CKD (chronic kidney disease)   . Demand ischemia (HCC) 10/29/2015  . Essential hypertension 10/29/2015  . PVC (premature ventricular contraction) 10/29/2015  . Moderate aortic stenosis 10/29/2015    Medications:  Scheduled:  . acetaminophen  650 mg Oral QHS  . amLODipine  5 mg Oral Daily  . ciprofloxacin  250 mg Oral Q18H  . dorzolamide  1 drop Both Eyes BID  . ipratropium-albuterol  3 mL Nebulization Q6H  . lamoTRIgine  25 mg Oral QHS  . latanoprost  1 drop Both Eyes QHS  . LORazepam  0.5 mg Intravenous QHS,MR X 1  . memantine  10 mg Oral BID  . metoprolol  tartrate  12.5 mg Oral BID  . multivitamin with minerals  1 tablet Oral Daily  . omega-3 acid ethyl esters  1 g Oral Daily  . sodium chloride flush  3 mL Intravenous Q12H  . tamsulosin  0.4 mg Oral Daily  . zinc sulfate  220 mg Oral BID   Infusions:  . sodium chloride 75 mL/hr at 10/29/15 1709  . heparin 1,200 Units/hr (10/29/15 2102)    Assessment: 82 yo male with bilateral DVT's ordered heparin drip for possible PE's. Patient receiving heparin 1350units/hr.   Goal of Therapy:  Heparin level 0.3-0.7 units/ml    Plan:  Heparin level therapeutic. Continue current rate. Pharmacy will continue to monitor daily.  Luz BrazenNathan A  [place], [place].D., BCPS Clinical Pharmacist  10/30/2015,5:35 AM

## 2015-10-31 ENCOUNTER — Telehealth: Payer: Self-pay | Admitting: Urology

## 2015-10-31 ENCOUNTER — Inpatient Hospital Stay: Payer: Medicare Other

## 2015-10-31 DIAGNOSIS — R0600 Dyspnea, unspecified: Secondary | ICD-10-CM | POA: Insufficient documentation

## 2015-10-31 DIAGNOSIS — J9600 Acute respiratory failure, unspecified whether with hypoxia or hypercapnia: Secondary | ICD-10-CM

## 2015-10-31 DIAGNOSIS — R296 Repeated falls: Secondary | ICD-10-CM

## 2015-10-31 DIAGNOSIS — R339 Retention of urine, unspecified: Secondary | ICD-10-CM

## 2015-10-31 DIAGNOSIS — G309 Alzheimer's disease, unspecified: Secondary | ICD-10-CM

## 2015-10-31 DIAGNOSIS — G8929 Other chronic pain: Secondary | ICD-10-CM

## 2015-10-31 DIAGNOSIS — M549 Dorsalgia, unspecified: Secondary | ICD-10-CM

## 2015-10-31 DIAGNOSIS — Z515 Encounter for palliative care: Secondary | ICD-10-CM

## 2015-10-31 DIAGNOSIS — N179 Acute kidney failure, unspecified: Secondary | ICD-10-CM

## 2015-10-31 DIAGNOSIS — F0280 Dementia in other diseases classified elsewhere without behavioral disturbance: Secondary | ICD-10-CM

## 2015-10-31 DIAGNOSIS — R0602 Shortness of breath: Secondary | ICD-10-CM

## 2015-10-31 DIAGNOSIS — F329 Major depressive disorder, single episode, unspecified: Secondary | ICD-10-CM

## 2015-10-31 DIAGNOSIS — I82403 Acute embolism and thrombosis of unspecified deep veins of lower extremity, bilateral: Secondary | ICD-10-CM

## 2015-10-31 DIAGNOSIS — E46 Unspecified protein-calorie malnutrition: Secondary | ICD-10-CM

## 2015-10-31 DIAGNOSIS — R1310 Dysphagia, unspecified: Secondary | ICD-10-CM

## 2015-10-31 LAB — CBC
HCT: 27.7 % — ABNORMAL LOW (ref 40.0–52.0)
Hemoglobin: 9.1 g/dL — ABNORMAL LOW (ref 13.0–18.0)
MCH: 28.5 pg (ref 26.0–34.0)
MCHC: 32.7 g/dL (ref 32.0–36.0)
MCV: 87.3 fL (ref 80.0–100.0)
PLATELETS: 256 10*3/uL (ref 150–440)
RBC: 3.17 MIL/uL — AB (ref 4.40–5.90)
RDW: 14.6 % — ABNORMAL HIGH (ref 11.5–14.5)
WBC: 10 10*3/uL (ref 3.8–10.6)

## 2015-10-31 LAB — RENAL FUNCTION PANEL
ANION GAP: 5 (ref 5–15)
Albumin: 2.2 g/dL — ABNORMAL LOW (ref 3.5–5.0)
BUN: 43 mg/dL — ABNORMAL HIGH (ref 6–20)
CHLORIDE: 114 mmol/L — AB (ref 101–111)
CO2: 22 mmol/L (ref 22–32)
Calcium: 8.1 mg/dL — ABNORMAL LOW (ref 8.9–10.3)
Creatinine, Ser: 1.69 mg/dL — ABNORMAL HIGH (ref 0.61–1.24)
GFR calc non Af Amer: 32 mL/min — ABNORMAL LOW (ref >60)
GFR, EST AFRICAN AMERICAN: 37 mL/min — AB (ref >60)
GLUCOSE: 117 mg/dL — AB (ref 65–99)
Phosphorus: 3.9 mg/dL (ref 2.5–4.6)
Potassium: 4.3 mmol/L (ref 3.5–5.1)
Sodium: 141 mmol/L (ref 135–145)

## 2015-10-31 MED ORDER — PIPERACILLIN-TAZOBACTAM 3.375 G IVPB
3.3750 g | Freq: Three times a day (TID) | INTRAVENOUS | Status: DC
  Administered 2015-10-31 – 2015-11-02 (×5): 3.375 g via INTRAVENOUS
  Filled 2015-10-31 (×7): qty 50

## 2015-10-31 MED ORDER — GLYCOPYRROLATE 1 MG PO TABS
1.0000 mg | ORAL_TABLET | Freq: Three times a day (TID) | ORAL | Status: DC | PRN
  Administered 2015-11-01 – 2015-11-02 (×3): 1 mg via ORAL
  Filled 2015-10-31 (×4): qty 1

## 2015-10-31 MED ORDER — MORPHINE SULFATE (PF) 2 MG/ML IV SOLN
INTRAVENOUS | Status: AC
  Administered 2015-10-31: 2 mg via INTRAVENOUS
  Filled 2015-10-31: qty 1

## 2015-10-31 MED ORDER — IPRATROPIUM-ALBUTEROL 0.5-2.5 (3) MG/3ML IN SOLN
3.0000 mL | Freq: Four times a day (QID) | RESPIRATORY_TRACT | Status: DC | PRN
  Administered 2015-10-31: 3 mL via RESPIRATORY_TRACT
  Filled 2015-10-31: qty 3

## 2015-10-31 MED ORDER — MORPHINE SULFATE (PF) 2 MG/ML IV SOLN
2.0000 mg | INTRAVENOUS | Status: DC | PRN
  Administered 2015-10-31: 2 mg via INTRAVENOUS
  Filled 2015-10-31: qty 1

## 2015-10-31 MED ORDER — MORPHINE SULFATE (PF) 2 MG/ML IV SOLN: 0.5000 mg | Freq: Four times a day (QID) | INTRAVENOUS | Status: DC | PRN

## 2015-10-31 MED ORDER — IPRATROPIUM-ALBUTEROL 0.5-2.5 (3) MG/3ML IN SOLN: 3.0000 mL | RESPIRATORY_TRACT | Status: DC | PRN

## 2015-10-31 MED ORDER — LORAZEPAM 0.5 MG PO TABS
0.5000 mg | ORAL_TABLET | ORAL | Status: DC | PRN
Start: 2015-10-31 — End: 2015-11-02

## 2015-10-31 MED ORDER — MORPHINE SULFATE (CONCENTRATE) 10 MG/0.5ML PO SOLN
5.0000 mg | ORAL | Status: DC | PRN
  Administered 2015-11-01 (×2): 5 mg via ORAL
  Filled 2015-10-31 (×2): qty 1

## 2015-10-31 MED ORDER — HYDROCODONE-ACETAMINOPHEN 5-325 MG PO TABS
1.0000 | ORAL_TABLET | Freq: Four times a day (QID) | ORAL | Status: DC | PRN
Start: 2015-10-31 — End: 2015-10-31

## 2015-10-31 MED ORDER — BISACODYL 10 MG RE SUPP: 10.0000 mg | Freq: Every day | RECTAL | Status: DC | PRN

## 2015-10-31 MED ORDER — FUROSEMIDE 10 MG/ML IJ SOLN
INTRAMUSCULAR | Status: AC
  Filled 2015-10-31: qty 2

## 2015-10-31 MED ORDER — HYDROCODONE-ACETAMINOPHEN 5-325 MG PO TABS: 0.5000 | ORAL_TABLET | Freq: Four times a day (QID) | ORAL | Status: DC | PRN

## 2015-10-31 MED ORDER — CIPROFLOXACIN HCL 500 MG PO TABS
250.0000 mg | ORAL_TABLET | Freq: Every day | ORAL | Status: DC
  Administered 2015-10-31: 250 mg via ORAL
  Filled 2015-10-31: qty 1

## 2015-10-31 MED ORDER — PROCHLORPERAZINE 25 MG RE SUPP
25.0000 mg | Freq: Three times a day (TID) | RECTAL | Status: DC | PRN
  Filled 2015-10-31: qty 1

## 2015-10-31 MED ORDER — DOXYCYCLINE HYCLATE 100 MG PO TABS
100.0000 mg | ORAL_TABLET | Freq: Two times a day (BID) | ORAL | Status: DC
  Filled 2015-10-31: qty 1

## 2015-10-31 MED ORDER — FUROSEMIDE 10 MG/ML IJ SOLN
20.0000 mg | Freq: Once | INTRAMUSCULAR | Status: AC
  Administered 2015-10-31: 20 mg via INTRAVENOUS

## 2015-10-31 NOTE — Progress Notes (Signed)
[facility]  ROUNDING NOTE   Subjective:   Daughter at bedside. Patient is lethargic from morphine.  Creatinine 1.69 (1.89) UOP 1650 (2500)  NS at 56mL/hr   Objective:  Vital signs in last 24 hours:  Temp:  [97.9 F (36.6 C)-99.1 F (37.3 C)] 98.3 F (36.8 C) (05/30 0828) Pulse Rate:  [75-77] 77 (05/30 0828) Resp:  [16-20] 16 (05/30 0828) BP: (124-141)/(50-72) 141/72 mmHg (05/30 0828) SpO2:  [91 %-93 %] 93 % (05/30 0828) FiO2 (%):  [28 %] 28 % (05/30 0756)  Weight change:  Filed Weights   10/27/15 1101  Weight: 79.379 kg (175 lb)    Intake/Output: I/O last 3 completed shifts: In: 2702.5 [P.O.:360; I.V.:2342.5] Out: 4150 [Urine:4150]   Intake/Output this shift:  Total I/O In: 360 [P.O.:360] Out: 375 [Urine:375]  Physical Exam: General: NAD, resting in bed  Head: Normocephalic, atraumatic. Moist oral mucosal membranes  Eyes: Anicteric  Neck: Supple, trachea midline  Lungs:  Clear to auscultation  Heart: S1S2 no rubs  Abdomen:  Soft, nontender, BS present   Extremities: trace peripheral edema.  Neurologic: Nonfocal, moving all four extremities  Skin: No lesions  GU: Foley    Basic Metabolic Panel:  Recent Labs Lab 10/27/15 0925 10/28/15 0423 10/29/15 0431 10/30/15 0504 10/31/15 0511  NA 139 138 138 141 141  K 4.9 4.4 4.7 4.5 4.3  CL 108 111 109 114* 114*  CO2 20* 19* GLUCOSE 119* 107* 115* 129* 117*  BUN 91* 72* 64* 55* 43*  CREATININE 4.41* 2.86* 2.16* 1.89* 1.69*  CALCIUM 8.9 8.1* 8.3* 8.2* 8.1*  PHOS  --   --   --   --  3.9    Liver Function Tests:  Recent Labs Lab 10/31/15 0511  ALBUMIN 2.2*   No results for input(s): LIPASE, AMYLASE in the last 168 hours. No results for input(s): AMMONIA in the last 168 hours.  CBC:  Recent Labs Lab 10/27/15 0925 10/27/15 1618 10/27/15 1830 10/28/15 0423 10/29/15 0431 10/30/15 0504 10/31/15 0511  WBC 15.6*  --   --  16.1* 10.8* 17.6* 10.0  NEUTROABS 13.0*  --    --   --   --   --   --   HGB 11.7* 11.5* 12.3* 10.1* 9.9* 9.4* 9.1*  HCT 34.8*  --   --  30.7* 30.9* 28.9* 27.7*  MCV 86.4  --   --  87.0 88.1 86.9 87.3  PLT 159 152  --  153 168 239 256    Cardiac Enzymes:  Recent Labs Lab 10/27/15 2300 10/28/15 1703 10/28/15 2223 10/29/15 0431 10/29/15 1056  TROPONINI 0.69* 3.19* 2.82* 3.49* 3.21*    BNP: Invalid input(s): POCBNP  CBG: No results for input(s): GLUCAP in the last 168 hours.  Microbiology: Results for orders placed or performed during the hospital encounter of 10/27/15  Blood culture (routine x 2)     Status: None (Preliminary result)   Collection Time: 10/27/15  9:25 AM  Result Value Ref Range Status   Specimen Description BLOOD LEFT FOREARM  Final   Special Requests BOTTLES DRAWN AEROBIC AND ANAEROBIC  10CC  Final   Culture NO GROWTH 4 DAYS  Final   Report Status PENDING  Incomplete  Blood culture (routine x 2)     Status: None (Preliminary result)   Collection Time: 10/27/15  9:25 AM  Result Value Ref Range Status   Specimen Description BLOOD LEFT ASSIST CONTROL  Final   Special Requests BOTTLES DRAWN  AEROBIC AND ANAEROBIC  7CC  Final   Culture NO GROWTH 4 DAYS  Final   Report Status PENDING  Incomplete  Urine culture     Status: Abnormal   Collection Time: 10/27/15  9:25 AM  Result Value Ref Range Status   Specimen Description URINE, RANDOM  Final   Special Requests NONE  Final   Culture (A)  Final    >=100,000 COLONIES/mL STAPHYLOCOCCUS SPECIES (COAGULASE NEGATIVE)   Report Status 10/29/2015 FINAL  Final   Organism ID, Bacteria STAPHYLOCOCCUS SPECIES (COAGULASE NEGATIVE) (A)  Final      Susceptibility   Staphylococcus species (coagulase negative) - MIC*    CIPROFLOXACIN 2 INTERMEDIATE Intermediate     GENTAMICIN <=0.5 SENSITIVE Sensitive     NITROFURANTOIN <=16 SENSITIVE Sensitive     OXACILLIN Value in next row Resistant      >=4 RESISTANTWARNING: For oxacillin-resistant S.aureus and coagulase-negative  staphylococci (MRS), other beta-lactam agents, ie, penicillins, beta-lactam/beta-lactamase inhibitor combinations, cephems (with the exception of the cephalosporins with anti-MRSA activity), and carbapenems, may appear active in vitro, but are not effective clinically.  --CLSI, Vol.32 No.3, January 2012, pg 70.    TETRACYCLINE Value in next row Sensitive      >=4 RESISTANTWARNING: For oxacillin-resistant S.aureus and coagulase-negative staphylococci (MRS), other beta-lactam agents, ie, penicillins, beta-lactam/beta-lactamase inhibitor combinations, cephems (with the exception of the cephalosporins with anti-MRSA activity), and carbapenems, may appear active in vitro, but are not effective clinically.  --CLSI, Vol.32 No.3, January 2012, pg 70.    VANCOMYCIN Value in next row Sensitive      >=4 RESISTANTWARNING: For oxacillin-resistant S.aureus and coagulase-negative staphylococci (MRS), other beta-lactam agents, ie, penicillins, beta-lactam/beta-lactamase inhibitor combinations, cephems (with the exception of the cephalosporins with anti-MRSA activity), and carbapenems, may appear active in vitro, but are not effective clinically.  --CLSI, Vol.32 No.3, January 2012, pg 70.    TRIMETH/SULFA Value in next row Sensitive      >=4 RESISTANTWARNING: For oxacillin-resistant S.aureus and coagulase-negative staphylococci (MRS), other beta-lactam agents, ie, penicillins, beta-lactam/beta-lactamase inhibitor combinations, cephems (with the exception of the cephalosporins with anti-MRSA activity), and carbapenems, may appear active in vitro, but are not effective clinically.  --CLSI, Vol.32 No.3, January 2012, pg 70.    CLINDAMYCIN Value in next row Sensitive      >=4 RESISTANTWARNING: For oxacillin-resistant S.aureus and coagulase-negative staphylococci (MRS), other beta-lactam agents, ie, penicillins, beta-lactam/beta-lactamase inhibitor combinations, cephems (with the exception of the cephalosporins with anti-MRSA  activity), and carbapenems, may appear active in vitro, but are not effective clinically.  --CLSI, Vol.32 No.3, January 2012, pg 70.    RIFAMPIN Value in next row Sensitive      >=4 RESISTANTWARNING: For oxacillin-resistant S.aureus and coagulase-negative staphylococci (MRS), other beta-lactam agents, ie, penicillins, beta-lactam/beta-lactamase inhibitor combinations, cephems (with the exception of the cephalosporins with anti-MRSA activity), and carbapenems, may appear active in vitro, but are not effective clinically.  --CLSI, Vol.32 No.3, January 2012, pg 70.    * >=100,000 COLONIES/mL STAPHYLOCOCCUS SPECIES (COAGULASE NEGATIVE)  MRSA PCR Screening     Status: None   Collection Time: 10/27/15  5:14 PM  Result Value Ref Range Status   MRSA by PCR NEGATIVE NEGATIVE Final    Comment:        The GeneXpert MRSA Assay (FDA approved for NASAL specimens only), is one component of a comprehensive MRSA colonization surveillance program. It is not intended to diagnose MRSA infection nor to guide or monitor treatment for MRSA infections.     Coagulation Studies: No results  for input(s): LABPROT, INR in the last 72 hours.  Urinalysis: No results for input(s): COLORURINE, LABSPEC, PHURINE, GLUCOSEU, HGBUR, BILIRUBINUR, KETONESUR, PROTEINUR, UROBILINOGEN, NITRITE, LEUKOCYTESUR in the last 72 hours.  Invalid input(s): APPERANCEUR    Imaging: Dg Chest Port 1 View  10/29/2015  CLINICAL DATA:  Renal failure, history of tuberculosis in the right lung EXAM: PORTABLE CHEST 1 VIEW COMPARISON:  10/27/2015 FINDINGS: Cardiac shadow is stable. Chronic changes in the right hemi thorax are again noted and stable. The left lung remains clear. No sizable effusion or focal infiltrate is noted. No acute bony abnormality is seen. IMPRESSION: Chronic changes in the right hemi thorax stable from the prior exam. Electronically Signed   By: Alcide CleverMark  Lukens M.D.   On: 10/29/2015 15:19     Medications:   . sodium  chloride 50 mL/hr at 10/31/15 0433   . acetaminophen  650 mg Oral QHS  . amLODipine  5 mg Oral Daily  . apixaban  10 mg Oral BID   Followed by  . [START ON 11/06/2015] apixaban  5 mg Oral BID  . ciprofloxacin  250 mg Oral Daily  . dorzolamide  1 drop Both Eyes BID  . lamoTRIgine  25 mg Oral QHS  . latanoprost  1 drop Both Eyes QHS  . LORazepam  0.5 mg Intravenous QHS,MR X 1  . memantine  10 mg Oral BID  . metoprolol tartrate  12.5 mg Oral BID  . multivitamin with minerals  1 tablet Oral Daily  . omega-3 acid ethyl esters  1 g Oral Daily  . sodium chloride flush  3 mL Intravenous Q12H  . tamsulosin  0.4 mg Oral Daily  . zinc sulfate  220 mg Oral BID   acetaminophen **OR** acetaminophen, ipratropium-albuterol, morphine injection, ondansetron **OR** ondansetron (ZOFRAN) IV, polyethylene glycol, senna-docusate  Assessment/ Plan:  82 y.o. white male with  Hypertension, hyperlipidemia, Alzheimer's disease, depression, chronic kidney disease stage III, who [facility] for evaluation of bilateral lower extremity edema left greater than right, found to have acute DVT.   1. Acute renal failure on CKD stage III. Baseline creatinine reported at 1.59.  Acute renal failure secondary to urinary retention and overdiuresis with furosemide.  - Creatinine improving.  - Discontinue IV fluids.  - Renally dose all medications  2. Urinary retention with subsequent gross hematuria/history of uretheral stricture/Complicated right renal cyst. Patient with difficult Foley placement. He apparently has history of urethral stricture. . Incidentally he was also found to have a complicated right renal cyst with thin septations.  - Patient will need follow up with urology. Keep foley catheter.   3. Anemia of chronic kidney disease. Hemoglobin down slightly  - will monitor  4. Urinary tract infection coag negative staph: leukocytosis improving. Afebrile - Cipro   LOS: 4 Yuval Nolet,  Gianno Volner 5/30/201711:09 AM

## 2015-10-31 NOTE — Progress Notes (Signed)
Speech Language Pathology Treatment: Dysphagia  Patient Details Name: Maurice SchmidtHarold Carney MRN: [id number] DOB: 02-25-1917 Today's Date: 10/31/2015 Time: 0800-0900 SLP Time Calculation (min) (ACUTE ONLY): 60 min  Assessment / Plan / Recommendation Clinical Impression  Pt appears to present w/ min increased risk for aspiration sec. to declined medical status and weakness currently in light of his advanced age. Pt also recently given Morphine and is drowsy. During pt's awakened time, he participated in the tx session by holding the cup for drinking as was able to assist in feeding self. Pt exhibited a slight throat clear w/ cough x1 post drinking multiple swallows of thin liquids; when taking small, single sips of thin liquids, no overt s/s of aspiration noted. Pt exhibited min increased oral phase time for oral clearing of particulate pieces of foods(grits) but was able to adequately masticate soft foods and swallow/clear when alternating foods w/ more moist foods. Time was given b/t trials to lessen any WOB. Pt tolerated pills swallowed Whole w/ Puree.  Discussed w/ Dtr pt's increased risk for aspiration at this time sec. current medical status. Discussed need for aspiration precautions and support during meals to lessen exertion, fatigue w/ po's. Discussed food and drink options to include cream soups, naturally nectar consistency drinks of buttermilk and V-8 juice. Dtr stated she understood the concern for aspiration at this time and agreed she wanted to use the aspiration precautions w/ slight modifications to his diet to determine if sufficient for safe oral intake b/f modifying to a thickened liquid consistency in his diet. Handout on aspiration precautions given to Dtr as pt may d/c to SNF soon. ST will f/u w/ admitted. NSG updated.    HPI HPI: Patient admitted with ARF and urinary retention, possible UTI. MD reported Sundowning, bilat. LE DVT. Pt is tolerating his diet but had some difficulty when  drinking his nutritional supplement over the weekend; Dtr feels he may have drunk too much too fast. Currently, pt is minimally drowsy d/t Morphine being given for pain this AM per NSG. Diet consistency changed to mech soft w/ aspiration precautions last PM per SLP until this evaluation.       SLP Plan  Continue with current plan of care     Recommendations  Diet recommendations: Dysphagia 3 (mechanical soft);Thin liquid Liquids provided via: No straw;Cup Medication Administration: Whole meds with puree Supervision: Patient able to self feed;Staff to assist with self feeding;Intermittent supervision to cue for compensatory strategies Compensations: Minimize environmental distractions;Slow rate;Small sips/bites;Lingual sweep for clearance of pocketing;Follow solids with liquid;Multiple dry swallows after each bite/sip Postural Changes and/or Swallow Maneuvers: Seated upright 90 degrees;Upright 30-60 min after meal             General recommendations:  (Dietician) Oral Care Recommendations: Oral care BID;Staff/trained caregiver to provide oral care Follow up Recommendations: Skilled Nursing facility (TBD) Plan: Continue with current plan of care     GO               Jerilynn SomKatherine Emmalia Heyboer, MS, CCC-SLP  Maurice Carney 10/31/2015, 1:11 PM

## 2015-10-31 NOTE — Consult Note (Addendum)
Palliative Medicine Inpatient Consult Note   Name: Maurice Carney Date: 10/31/2015 MRN: [id number]  DOB: [birth year]  Referring Physician: Milagros Loll, MD  Palliative Care consult requested for this 82 y.o. male for goals of medical therapy in patient with acute renal failure and DVTs.  DISCUSSIONS AND PLAN: Just prior to walking into the room, the patient had become acutely short of breath with sats dropping into the 70's. This was just as he was finishing up with a bed bath.  He was placed on 100% nonrebreather and sats are now in the high 90's.  His daughter was quite distraught.   We talked and the daughter decided to lean more in a palliative direction than she had done till now. She asked for DNR status.  I asked for permission to change a number of his meds over to more of a comfort approach, while leaving the Eliquis.  Apparently, on Sunday, he had a transient episode of shortness of breath but he recovered faster than he is now.  Nursing was concerned that it could be fluid related and a CXR order was obtained.  Daughter (a Engineer, civil (consulting)) and I discussed lasix at a low dose one time and this was ordered (20 mg).  I am concerned this may represent PE --with movement of clot into a larger pulmonary artery.  Daughter is aware of my concerns. We also talked about the IVC benefits/ risks/ limitations (need to be off NOAC thinner for several days and other issues). She is not inclined to want the IVC filter --especially now that he is in such resp. Distress.  For now, we will watch the pt over 4 hours--since he could turn around. BUT this is only possible because a single 2 mg dose of morphine helped him become comfortable fairly quickly after it was given. I have changed his morphine order to permit dosing every 1 hour if needed and daughter is aware and agrees.  We talked about options of SNF with rehab at [facility] (if he turns back around quickly and can do rehab--this doesn't seem likely now, however);   ALF with Hospice (back at [facility] ---daughter was not too excited about this option --pts wife doesn't even know him now so him being there is not going to be helpful for her to have him there and daughter would like pt to have skilled nurses assessing his shortness of breath and need for morphine etc).  We mentioned [facility] also. Son-in-law was in the room and mentioned that his mother passed away there so they are quite familiar with this.    The plan for now is to see how he does for the next 4-5 hours and then discuss options again at that point.  I have updated attending and social work and nursing.    I am changing some orders as directed by pt's daughter. I was able to ask pt if Hospice would be OK with him and he said that it would be.    I will check back every hour or two for several hours today and talk again with daughter.   CLINICAL NARRATIVE: Maurice Carney is a 82 yo man who had been living at [facility] with his wife when he was brought to the ED with a complaint of leg edema and back pain.  He had been short of breath recently as well. He was given a dose of lasix 40 mg by [employer] provider.  He was found to have bilateral DVTs  on lower extremity dopplers and the clot burden was high with clot throughout the left leg venous system and clot also in the contralateral right common femoral vein.  He was noted to be in acute renal failure and was found to have urinary retention but no hydronephrosis on renal US.  A foley was placed and Urology was consulted recommending pt have an outpt appt for Foley removal and reassessment.  Cardiololgy saw pt due to elevated troponins felt due to ARF and demand ischemia.  Conservate management was recommended. He actually had TWO echos performed and the reports vary a bit, likely due to technical difficulty. Moderate aortic stenosis is noted in one record.   Systolic function is preserved overall..  Pt was to go to [facility] for  rehab, but there was a question as to how aggressive physicians should be given pts advanced age and his recent fall history (4-5 falls recently).  He did not get a V/Q scan b/c it was felt that whether or not he has a PE, the treatment would be the same.  The question of an IVC filter arose and this was to be discussed with daughter, who is a Engineer, civil (consulting) working for [employer] (She is actually a [employer] doing this job).    A Palliative Care Consult was requested   ACTIVE PROBLEMS: Acute Resp Failure Alzheimer's Dementia ---with sundowning and recent increase in falls Moderate Malnutrition Dysphagia --on Dys 3 with thins (meds w/ pureed ---needs int supervisions, small sips and bites alt liquids with solids Blat Leg DVTs  ---HIgh risk for falls  (?IVC a possible option) Acute Renal Failure on CKD  Baseline Cr =1.59 (Stage 3-4) --due to urinary retention and dehydration --Uro has seen and pt to have foley removed with cystoscopy as outpt Anemia Elevated Troponins due to ARF with demand ischemia Moderate Aortic Stenosis Chronic back pain   PAST MEDICAL HISTORY: Past Medical History  Diagnosis Date  . Hypertension   . Hyperlipidemia   . Alzheimer disease   . MDD (major depressive disorder) (HCC)   . CKD (chronic kidney disease)   . Demand ischemia (HCC) 10/29/2015  . Essential hypertension 10/29/2015  . PVC (premature ventricular contraction) 10/29/2015  . Moderate aortic stenosis 10/29/2015    PAST SURGICAL HISTORY:  Past Surgical History  Procedure Laterality Date  . Transurethral resection of prostate       REVIEW OF SYSTEMS:  Very short of breath --says "I am not that comfortable" Cannot give full ROS due to distress    SPIRITUAL SUPPORT SYSTEM: Yes.  SOCIAL HISTORY:  reports that he has never smoked. He does not have any smokeless tobacco history on file. He reports that he does not drink alcohol. Pt has a granddaughter who is estranged from daughter (granddaughter  is the child of pts son who has passed away).  There is much conflict involved that was shared by daughter.  Daughter may update granddaughter as to condition of pt, but wanted [place] to be aware that there has been much conflict. She had previously been HCPOA, but now daughter is and document is valid.    LEGAL DOCUMENTS:  Living Will --indicating wishes for no life prolonging measures if terminally ill And HCPOA (daughter is HCPOA)   CODE STATUS: Full code  PAST MEDICAL HISTORY: Past Medical History  Diagnosis Date  . Hypertension   . Hyperlipidemia   . Alzheimer disease   . MDD (major depressive disorder) (HCC)   . CKD (chronic kidney disease)   .  Demand ischemia (HCC) 10/29/2015  . Essential hypertension 10/29/2015  . PVC (premature ventricular contraction) 10/29/2015  . Moderate aortic stenosis 10/29/2015    PAST SURGICAL HISTORY:  Past Surgical History  Procedure Laterality Date  . Transurethral resection of prostate      ALLERGIES:  is allergic to sulfa antibiotics.  MEDICATIONS:  Current Facility-Administered Medications  Medication Dose Route Frequency Provider Last Rate Last Dose  . acetaminophen (TYLENOL) tablet 650 mg  650 mg Oral Q6H PRN Adrian Saran, MD       Or  . acetaminophen (TYLENOL) suppository 650 mg  650 mg Rectal Q6H PRN Adrian Saran, MD      . acetaminophen (TYLENOL) tablet 650 mg  650 mg Oral QHS Adrian Saran, MD   650 mg at 10/30/15 2108  . amLODipine (NORVASC) tablet 5 mg  5 mg Oral Daily Adrian Saran, MD   5 mg at 10/31/15 0908  . apixaban (ELIQUIS) tablet 10 mg  10 mg Oral BID Gracelyn Nurse, MD   10 mg at 10/31/15 0908   Followed by  . [START ON 11/06/2015] apixaban (ELIQUIS) tablet 5 mg  5 mg Oral BID Gracelyn Nurse, MD      . ciprofloxacin (CIPRO) tablet 250 mg  250 mg Oral Daily Srikar Sudini, MD   250 mg at 10/31/15 0908  . dorzolamide (TRUSOPT) 2 % ophthalmic solution 1 drop  1 drop Both Eyes BID Adrian Saran, MD   1 drop at 10/31/15 0911  .  HYDROcodone-acetaminophen (NORCO/VICODIN) 5-325 MG per tablet 0.5 tablet  0.5 tablet Oral Q6H PRN Srikar Sudini, MD      . ipratropium-albuterol (DUONEB) 0.5-2.5 (3) MG/3ML nebulizer solution 3 mL  3 mL Nebulization Q6H PRN Milagros Loll, MD   3 mL at 10/31/15 1449  . lamoTRIgine (LAMICTAL) tablet 25 mg  25 mg Oral QHS Adrian Saran, MD   25 mg at 10/30/15 2108  . latanoprost (XALATAN) 0.005 % ophthalmic solution 1 drop  1 drop Both Eyes QHS Adrian Saran, MD   1 drop at 10/30/15 2107  . LORazepam (ATIVAN) injection 0.5 mg  0.5 mg Intravenous QHS,MR X 1 Gracelyn Nurse, MD   0.5 mg at 10/29/15 2200  . memantine (NAMENDA) tablet 10 mg  10 mg Oral BID Adrian Saran, MD   10 mg at 10/31/15 0908  . metoprolol tartrate (LOPRESSOR) tablet 12.5 mg  12.5 mg Oral BID Gracelyn Nurse, MD   12.5 mg at 10/31/15 0908  . morphine 2 MG/ML injection 0.5 mg  0.5 mg Intravenous Q6H PRN Milagros Loll, MD      . multivitamin with minerals tablet 1 tablet  1 tablet Oral Daily Adrian Saran, MD   1 tablet at 10/31/15 0908  . omega-3 acid ethyl esters (LOVAZA) capsule 1 g  1 g Oral Daily Adrian Saran, MD   1 g at 10/31/15 0908  . ondansetron (ZOFRAN) tablet 4 mg  4 mg Oral Q6H PRN Adrian Saran, MD       Or  . ondansetron (ZOFRAN) injection 4 mg  4 mg Intravenous Q6H PRN Sital Mody, MD      . polyethylene glycol (MIRALAX / GLYCOLAX) packet 17 g  17 g Oral Daily PRN Adrian Saran, MD      . senna-docusate (Senokot-S) tablet 1 tablet  1 tablet Oral QHS PRN Adrian Saran, MD   1 tablet at 10/30/15 2108  . sodium chloride flush (NS) 0.9 % injection 3 mL  3 mL Intravenous Q12H Sital  Mody, MD   3 mL at 10/31/15 1000  . tamsulosin (FLOMAX) capsule 0.4 mg  0.4 mg Oral Daily Adrian SaranSital Mody, MD   0.4 mg at 10/31/15 0908  . zinc sulfate capsule 220 mg  220 mg Oral BID Adrian SaranSital Mody, MD   220 mg at 10/31/15 0908    Vital Signs: BP 128/52 mmHg  Pulse 76  Temp(Src) 98.5 F (36.9 C) (Oral)  Resp 20  Ht 6\' 4"  (1.93 m)  Wt 79.379 kg (175 lb)  BMI 21.31  kg/m2  SpO2 93% Filed Weights   10/27/15 1101  Weight: 79.379 kg (175 lb)    Estimated body mass index is 21.31 kg/(m^2) as calculated from the following:   Height as of this encounter: 6\' 4"  (1.93 m).   Weight as of this encounter: 79.379 kg (175 lb).  PERFORMANCE STATUS (ECOG) : 4 - Bedbound  PHYSICAL EXAM: In distress --with facemask O2 on at 100% Using access muscles with each breath Eyes open weakly when talked to  Says he is not comfortable Possible JVD hrt rrr no m Lungs with ronchi and rales Abd soft and NT Ext no mottling or cyanosis as yet.    LABS: CBC:    Component Value Date/Time   WBC 10.0 10/31/2015 0511   HGB 9.1* 10/31/2015 0511   HCT 27.7* 10/31/2015 0511   PLT 256 10/31/2015 0511   MCV 87.3 10/31/2015 0511   NEUTROABS 13.0* 10/27/2015 0925   LYMPHSABS 1.6 10/27/2015 0925   MONOABS 1.0 10/27/2015 0925   EOSABS 0.1 10/27/2015 0925   BASOSABS 0.1 10/27/2015 0925   Comprehensive Metabolic Panel:    Component Value Date/Time   NA 141 10/31/2015 0511   K 4.3 10/31/2015 0511   CL 114* 10/31/2015 0511   CO2 22 10/31/2015 0511   BUN 43* 10/31/2015 0511   CREATININE 1.69* 10/31/2015 0511   GLUCOSE 117* 10/31/2015 0511   CALCIUM 8.1* 10/31/2015 0511   ALBUMIN 2.2* 10/31/2015 0511    ECHO 10/28/15: - Procedure narrative: Transthoracic echocardiography. Image  quality was suboptimal. The study was technically difficult, as a  result of poor acoustic windows and poor sound wave transmission. - Left ventricle: Not able to accurately assess LV function due to  poor visualization. There appears to be hypokinesis of the  inferoseptum. However, endocardial border definition is poor.  Suggest repeating the study with echo contrast for mor accurate  LVEF and wall motion assessment. The cavity size was normal.  Systolic function was normal. Wall motion was normal; there were  no regional wall motion abnormalities. Doppler parameters are  consistent  with abnormal left ventricular relaxation (grade 1  diastolic dysfunction). Doppler parameters are consistent with  indeterminate ventricular filling pressure. - Aortic valve: At least moderately thickened, moderately calcified  leaflets. There was moderate stenosis. There was no  regurgitation. - Mitral valve: There was trivial regurgitation. - Tricuspid valve: There was no regurgitation.  More than 50% of the visit was spent in counseling/coordination of care: Yes  Time Spent: 120 minutes

## 2015-10-31 NOTE — Telephone Encounter (Signed)
I made this appt and will call the patient once [facility]  Michelle

## 2015-10-31 NOTE — Progress Notes (Signed)
Pharmacy Antibiotic Note  Maurice Carney is a 82 y.o. male admitted on 10/27/2015 with pneumonia (HCAP).  Pharmacy has been consulted for Zosyn dosing.  Plan: Zosyn 3.375g IV q8h (4 hour infusion).  Height: 6\' 4"  (193 cm) Weight: 175 lb (79.379 kg) IBW/kg (Calculated) : 86.8  Temp (24hrs), Avg:98.2 F (36.8 C), Min:97.9 F (36.6 C), Max:98.5 F (36.9 C)   Recent Labs Lab 10/27/15 0925 10/28/15 0423 10/29/15 0431 10/30/15 0504 10/31/15 0511  WBC 15.6* 16.1* 10.8* 17.6* 10.0  CREATININE 4.41* 2.86* 2.16* 1.89* 1.69*  LATICACIDVEN 1.6  --   --   --   --     Estimated Creatinine Clearance: 28.1 mL/min (by C-G formula based on Cr of 1.69).    Allergies  Allergen Reactions  . Sulfa Antibiotics     Antimicrobials this admission:   Dose adjustments this admission:   Microbiology results:   Thank you for allowing pharmacy to be a part of this patient's care.  Marty HeckWang, [name] 10/31/2015 9:18 PM

## 2015-10-31 NOTE — Care Management Important Message (Signed)
Important Message  Patient Details  Name: Maurice SchmidtHarold Carney MRN: [id number] Date of Birth: Apr 21, 1917   Medicare Important Message Given:  Yes    [name], RN 10/31/2015, 8:34 AM

## 2015-10-31 NOTE — Progress Notes (Signed)
Palliative Care Update   Pt is looking a bit better now after he had that episode of acute respiratory distress. He is on 6 LPM [place] Oxygen with sats in the high 90's.  The morphine helped --but he only needed one dose.  He continues to look like he is very weak.  His xray suggests fluid/ pneumonia.  The lasix may have helped as this am's urine output was down (after IV fluids were DCd).  I showed the daughter the cxr and she commented that the right side has always looked scarred due to a history of TB.  But the left side is worrisome looking.  She commented that is might be a good idea to try some ABX --but she didn't seem sure of what she was asking for. She seems to be quite ambivalent about doing anything aggressive, but she is not 100% on board for total comfort care. She was asked about labs and she does not want him to get labs. I am basically changing pt over to a MODIFIED COMFORT CARE status --with Eliquis and Glaucoma drops to continue along with comfort med orders.  Also, I have talked with Dr. Elpidio AnisSudini and he will start empiric ABX and we can reassess (again) in the am.  Daughter is definitely leaning in the direction of wanting comfort measures more than any type of approach that would prolong his suffering. But, she would like to see how ABX help or don't help in the next day or two.    She is not interested in pursuing an IVC at this time.   Suan HalterMargaret F Shacoria Latif, MD

## 2015-10-31 NOTE — Progress Notes (Signed)
   10/31/15 1730  Clinical Encounter Type  Visited With Patient and family together  Visit Type Initial  Referral From Nurse  Consult/Referral To Chaplain  Spiritual Encounters  Spiritual Needs Emotional  Stress Factors  Patient Stress Factors Health changes;Major life changes  Family Stress Factors Major life changes  Met w/patient and family. Patient was very alert and talking with clarity and humor. Visited with family and offered any spiritual support desired. Chap. Candace Ramus G. Gibbon

## 2015-10-31 NOTE — Progress Notes (Signed)
Subjective: Patient admitted with ARF and urinary retention. No complaints today from patient. He has been getting morphine for back and lower extremity pain. He normally takes Tylenol for pain. On 2 L oxygen. Daughter at bedside.  Review of systems Patient is poor historian with confusion. Denies any concerns.  Objective: Vital signs in last 24 hours: Temp:  [97.9 F (36.6 C)-99.1 F (37.3 C)] 98.5 F (36.9 C) (05/30 1248) Pulse Rate:  [75-77] 76 (05/30 1248) Resp:  [16-20] 20 (05/30 1248) BP: (128-141)/(50-72) 128/52 mmHg (05/30 1248) SpO2:  [91 %-93 %] 93 % (05/30 1248) FiO2 (%):  [28 %] 28 % (05/30 0756) Weight change:  Last BM Date: 10/27/15  Intake/Output from previous day: 05/29 0701 - 05/30 0700 In: 2702.5 [P.O.:360; I.V.:2342.5] Out: 1650 [Urine:1650] Intake/Output this shift: Total I/O In: 360 [P.O.:360] Out: 375 [Urine:375]   General: No acute distress. Sleeping. Wakes up on calling his name.weak cough HEENT: atraumatic.Marland KitchenNo JVD. No pallor or icterus CV: RRR.  1/6 systolic murmur. Bilateral lower extremity edema left greater than right Lungs: bilateral coarse breath sounds. Mild wheezing. GI: Soft nontender nondistended. BS + Foley catheter in place.  Lab Results:  Recent Labs  10/30/15 0504 10/31/15 0511  WBC 17.6* 10.0  HGB 9.4* 9.1*  HCT 28.9* 27.7*  PLT 239 256   BMET  Recent Labs  10/30/15 0504 10/31/15 0511  NA 141 141  K 4.5 4.3  CL 114* 114*  CO2 22 22  GLUCOSE 129* 117*  BUN 55* 43*  CREATININE 1.89* 1.69*  CALCIUM 8.2* 8.1*    Studies/Results: Dg Chest Port 1 View  10/29/2015  CLINICAL DATA:  Renal failure, history of tuberculosis in the right lung EXAM: PORTABLE CHEST 1 VIEW COMPARISON:  10/27/2015 FINDINGS: Cardiac shadow is stable. Chronic changes in the right hemi thorax are again noted and stable. The left lung remains clear. No sizable effusion or focal infiltrate is noted. No acute bony abnormality is seen. IMPRESSION:  Chronic changes in the right hemi thorax stable from the prior exam. Electronically Signed   By: Alcide Clever M.D.   On: 10/29/2015 15:19    Medications:  Scheduled: . acetaminophen  650 mg Oral QHS  . amLODipine  5 mg Oral Daily  . apixaban  10 mg Oral BID   Followed by  . [START ON 11/06/2015] apixaban  5 mg Oral BID  . ciprofloxacin  250 mg Oral Daily  . dorzolamide  1 drop Both Eyes BID  . lamoTRIgine  25 mg Oral QHS  . latanoprost  1 drop Both Eyes QHS  . LORazepam  0.5 mg Intravenous QHS,MR X 1  . memantine  10 mg Oral BID  . metoprolol tartrate  12.5 mg Oral BID  . multivitamin with minerals  1 tablet Oral Daily  . omega-3 acid ethyl esters  1 g Oral Daily  . sodium chloride flush  3 mL Intravenous Q12H  . tamsulosin  0.4 mg Oral Daily  . zinc sulfate  220 mg Oral BID   Continuous:    Assessment/Plan:  * Bilateral Lower Ext DVT: was On IV heparin. Left leg tightly swollen. Change PO Eliquis today. Possible he could have PE also but would not change treatment plan at this point. Discussed with Dr. Mariah Milling of cardiology. Patient is a high risk for falls. He suggested IVC filter would be a possibility.  I have discussed with her daughter regarding having Dr. Orvan Falconer Dr. The patient and her about goals of care. We will decide  on the IVC filter after palliative care has seen the patient. Daughter leaning towards an IVC filter and should've anticoagulation due to recurrent falls.  * Acute Renal Failure: Secondary to urinary retention and dehydration.  Creatinine presently close to baseline. Stop IV fluids.  * Urinary Retention Urology has seen the patient. Plan is to have patient follow-up as outpatient for Foley removal and cystoscopy.  * UTI Coagulase-negative Staphylococcus. Blood cultures negative. No endocarditis on echo.  * Elevated Trop: Likely secondary to ARF ad demand ischemia. Echo has slight decreased EF but no wall motion abnormality. Cardiology evaluated and  agree with current medical care. No need for further cardiology work up.   * Questionable Aspiration Swallow evaluation done. Presently on dysphagia 3 diet with thin liquids.  * Inpatient delirium over early dementia: Improved with small dose of Ativan.  Time spent - 35 min  Greater than 50% time spent in coordination of care and discussing with daughter at bedside.  LOS: 4 days   Melesa Lecy, Molinda BailiffSrikar R 10/31/2015, 1:43 PM

## 2015-10-31 NOTE — Progress Notes (Signed)
Patient status post getting a bath complains of shortness of breath. SPO2 75. Dr Elpidio AnisSudini notified. PRN Breathing treatment currently being given. SPO2 now up to 88. Stat Chest xray ordered. Non rebreather placed on 10L Spo2 now at 95. Will continue to monitor patient and await for chest xray.

## 2015-11-01 LAB — CULTURE, BLOOD (ROUTINE X 2)
CULTURE: NO GROWTH
Culture: NO GROWTH

## 2015-11-01 NOTE — Progress Notes (Signed)
PT Cancellation Note  Patient Details Name: Maurice Carney MRN: [id number] DOB: [birth year]   Cancelled Treatment:    Reason Eval/Treat Not Completed: Patient not medically ready;Other (comment). Pt's chart reviewed. Per palliative care note pt is being placed on modified comfort care at this time and there are discontinue orders for PT. Current orders will be completed and new orders will be required if a need arises in the future.   Maurice Carney, PT, DPT  11/01/2015, 8:21 AM [phone]

## 2015-11-01 NOTE — Progress Notes (Signed)
Palliative Medicine Inpatient Consult Follow Up Note   Name: Maurice Carney Date: 11/01/2015 MRN: [id number]  DOB: [birth year]  Referring Physician: Milagros LollSrikar Sudini, MD  Palliative Care consult requested for this 82 y.o. male for goals of medical therapy in patient with acute on chronic resp failure.  DISCUSSION AND PLAN: I went into room and noted a full turn-out of many relatives and the patient's pastor.  Pt appears to be very weak and tired.  Pts wife, who has very advanced dementia and who does not even know her husband, has been brought here from [facility] and she is sitting bedside. She apparently thinks he is her father (per daughter's report).    Pt's labs were cancelled as daughter wants 'modified comfort care' which in this case, means antibiotics but not unnecessary meds or labs etc.  Pt continues on Zosyn and Eliquis.   One key thing we were going to look at was whether or not he ate today. He actually ate well at breakfast and at lunch --though this tires him out immensely.  He is still not looking like someone who could tolerate physical therapy or rehab.  He looks very ill and weak.  But he ate.   We discussed the granddaughter who is on her way from [place] --not sure of her arrival. Daughter hopes to heal the scars from their conflicted relationship.  She would hope that this granddaughter can see him while he is still alive.    I encouraged daughter to allow pt to BE ALONE in the room by himself at times.  He senses their presence and cannot fully rest with them all in there even if they are quiet.  She understands and agrees. They will give him 'some space' later today.    Bottom line is that she wants to go one more day with the ABX and current plan and then re-assess again.  I would think his O2 could be tapered down a bit, but we want to avoid having to go back up and definitely want to avoid Hi FLow or BIPAP.  I have clarified O2 orders.    CLINICAL NARRATIVE: Maurice SchmidtHarold  Carney is a 82 yo man who had [facility] living [facility] with his wife when he was brought to the ED with a complaint of leg edema and back pain. He had been short of breath recently as well. He was given a dose of lasix 40 mg by [facility] provider. He was found to have bilateral DVTs on lower extremity dopplers and the clot burden was high with clot throughout the left leg venous system and clot also in the contralateral right common femoral vein. He was noted to be in acute renal failure and was found to have urinary retention but no hydronephrosis on renal US. A foley was placed and Urology was consulted recommending pt have an outpt appt for Foley removal and reassessment. Cardiololgy saw pt due to elevated troponins felt due to ARF and demand ischemia. Conservate management was recommended. He actually had TWO echos performed and the reports vary a bit, likely due to technical difficulty. Moderate aortic stenosis is noted in one record.  Systolic function is preserved overall..  Pt was to go to [facility] for rehab, but there was a question as to how aggressive physicians should be given pts advanced age and his recent fall history (4-5 falls recently). He did not get a V/Q scan b/c it was felt that whether or not he has  a PE, the treatment would be the same. The question of an IVC filter arose and this was to be discussed with daughter, who is a Engineer, civil (consulting) working for [employer] (She is actually a [employer] doing this job).   A Palliative Care Consult was requestedHarold Carney is a 82 yo man who had been living at [facility] with his wife when he was brought to the ED with a complaint of leg edema and back pain. He had been short of breath recently as well. He was given a dose of lasix 40 mg by [facility] provider. He was found to have bilateral DVTs on lower extremity dopplers and the clot burden was high with clot throughout the left leg venous system  and clot also in the contralateral right common femoral vein. He was noted to be in acute renal failure and was found to have urinary retention but no hydronephrosis on renal US. A foley was placed and Urology was consulted recommending pt have an outpt appt for Foley removal and reassessment. Cardiololgy saw pt due to elevated troponins felt due to ARF and demand ischemia. Conservate management was recommended. He actually had TWO echos performed and the reports vary a bit, likely due to technical difficulty. Moderate aortic stenosis is noted in one record.  Systolic function is preserved overall..  Pt was to go to [facility] for rehab, but there was a question as to how aggressive physicians should be given pts advanced age and his recent fall history (4-5 falls recently). He did not get a V/Q scan b/c it was felt that whether or not he has a PE, the treatment would be the same. The question of an IVC filter arose and this was to be discussed with daughter, who is a Engineer, civil (consulting) working for [employer] (She is actually a [employer] doing this job).   A Palliative Care Consult was requested.  Yesterday, pt developed acute respiratory failure and he was made DNR and MODIFIED COMFORT CARE. The 'modified' component of the comfort care plan is due to the fact that his CXR suggests an acute pneumonia (possibly aspiration?) and so IV Zosyn is ordered. Also, Eliquis continues as do his glaucoma meds.  Pt had neither improved nor worsened since late yesterday, so daughter wishes to go one more day with this plan and then reassess again.    REVIEW OF SYSTEMS:  Patient is not able to provide ROS due to exhaustion and illness.  CODE STATUS: DNR   PAST MEDICAL HISTORY: Past Medical History  Diagnosis Date  . Hypertension   . Hyperlipidemia   . Alzheimer disease   . MDD (major depressive disorder) (HCC)   . CKD (chronic kidney disease)   . Demand ischemia (HCC) 10/29/2015  . Essential hypertension 10/29/2015   . PVC (premature ventricular contraction) 10/29/2015  . Moderate aortic stenosis 10/29/2015    PAST SURGICAL HISTORY:  Past Surgical History  Procedure Laterality Date  . Transurethral resection of prostate      Vital Signs: BP 147/62 mmHg  Pulse 72  Temp(Src) 99.1 F (37.3 C) (Oral)  Resp 26  Ht 6\' 4"  (1.93 m)  Wt 79.379 kg (175 lb)  BMI 21.31 kg/m2  SpO2 98% Filed Weights   10/27/15 1101  Weight: 79.379 kg (175 lb)    Estimated body mass index is 21.31 kg/(m^2) as calculated from the following:   Height as of this encounter: 6\' 4"  (1.93 m).   Weight as of this encounter: 79.379  kg (175 lb).  PHYSICAL EXAM: VERY weak appearing ---eyes open as he talks to visitors and to me, but then he quickly fell asleep He is a bit tremulous --shook my hand with a good grip, but this exhausted him and his arm quickly dropped to his side EOMI OP clear No JVD seen and no TM Hrt rrr no m Lungs with some coughing heard and some ronchi Abd soft and NT Ext no mottling or cyanosis  LABS: CBC:    Component Value Date/Time   WBC 10.0 10/31/2015 0511   HGB 9.1* 10/31/2015 0511   HCT 27.7* 10/31/2015 0511   PLT 256 10/31/2015 0511   MCV 87.3 10/31/2015 0511   NEUTROABS 13.0* 10/27/2015 0925   LYMPHSABS 1.6 10/27/2015 0925   MONOABS 1.0 10/27/2015 0925   EOSABS 0.1 10/27/2015 0925   BASOSABS 0.1 10/27/2015 0925   Comprehensive Metabolic Panel:    Component Value Date/Time   NA 141 10/31/2015 0511   K 4.3 10/31/2015 0511   CL 114* 10/31/2015 0511   CO2 22 10/31/2015 0511   BUN 43* 10/31/2015 0511   CREATININE 1.69* 10/31/2015 0511   GLUCOSE 117* 10/31/2015 0511   CALCIUM 8.1* 10/31/2015 0511   ALBUMIN 2.2* 10/31/2015 0511    More than 50% of the visit was spent in counseling/coordination of care: YES  Time Spent:  35 min

## 2015-11-01 NOTE — Progress Notes (Signed)
Subjective: Patient admitted with ARF and urinary retention. Deteriorated yesterday evening. On 6 L oxygen. Daughter at bedside.  Now DNR and comfort measures except IV abx and Eliquis.  Family waiting for other family members to arrive and hoping he will improve  Review of systems Patient is poor historian with confusion. Denies any concerns.  Objective: Vital signs in last 24 hours: Temp:  [98.8 F (37.1 C)-99.1 F (37.3 C)] 99.1 F (37.3 C) (05/31 0940) Pulse Rate:  [72-76] 72 (05/31 0940) Resp:  [26] 26 (05/31 0940) BP: (129-147)/(52-62) 147/62 mmHg (05/31 0940) SpO2:  [98 %] 98 % (05/31 0940) Weight change:  Last BM Date: 10/27/15  Intake/Output from previous day: 05/30 0701 - 05/31 0700 In: 530 [P.O.:480; IV Piggyback:50] Out: 1825 [Urine:1825] Intake/Output this shift: Total I/O In: -  Out: 650 [Urine:650]   General: No acute distress. Sleeping. Wakes up on calling his name.weak cough HEENT: atraumatic.Marland KitchenNo JVD. No pallor or icterus CV: RRR.  1/6 systolic murmur. Bilateral lower extremity edema left greater than right Lungs: bilateral coarse breath sounds. Mild wheezing. GI: Soft nontender nondistended. BS + Foley catheter in place.  Lab Results:  Recent Labs  10/30/15 0504 10/31/15 0511  WBC 17.6* 10.0  HGB 9.4* 9.1*  HCT 28.9* 27.7*  PLT 239 256   BMET  Recent Labs  10/30/15 0504 10/31/15 0511  NA 141 141  K 4.5 4.3  CL 114* 114*  CO2 22 22  GLUCOSE 129* 117*  BUN 55* 43*  CREATININE 1.89* 1.69*  CALCIUM 8.2* 8.1*    Studies/Results: Dg Chest Port 1 View  10/31/2015  CLINICAL DATA:  Hypoxia. EXAM: PORTABLE CHEST 1 VIEW COMPARISON:  Oct 29, 2015. FINDINGS: Stable cardiomediastinal silhouette. Stable diffuse opacification is noted in right lung with calcified pleural plaques and other pleural thickening. New left basilar airspace opacity is noted concerning for pneumonia or possibly edema. No pneumothorax is noted. Bony thorax is  unremarkable. IMPRESSION: Stable chronic changes are noted in the right hemithorax compared to prior exam. New left basilar opacity is noted concerning for pneumonia or possibly edema. Electronically Signed   By: Lupita Raider, M.D.   On: 10/31/2015 15:38    Medications:  Scheduled: . apixaban  10 mg Oral BID   Followed by  . [START ON 11/06/2015] apixaban  5 mg Oral BID  . dorzolamide  1 drop Both Eyes BID  . latanoprost  1 drop Both Eyes QHS  . metoprolol tartrate  12.5 mg Oral BID  . piperacillin-tazobactam (ZOSYN)  IV  3.375 g Intravenous Q8H  . sodium chloride flush  3 mL Intravenous Q12H   Continuous:    Assessment/Plan:  * Bilateral Lower Ext DVT: was On IV heparin PO Eliquis Possible he could have PE also but would not change treatment plan at this point.  * Acute Renal Failure: Secondary to urinary retention and dehydration.  Creatinine presently close to baseline. Stopped IV fluids.  * Urinary Retention Urology has seen the patient. Plan is to have patient follow-up as outpatient for Foley removal and cystoscopy.  * UTI Coagulase-negative Staphylococcus. Blood cultures negative. No endocarditis on echo.  * Elevated Trop: Likely secondary to ARF ad demand ischemia. Echo has slight decreased EF but no wall motion abnormality. Cardiology evaluated and agree with current medical care. No need for further cardiology work up.   * Questionable Aspiration  * Inpatient delirium over early dementia:  Plan is to continue IV Abx one more day  DNR  LOS: 5 days   Orie FishermanSudini, Yvetta Drotar R 11/01/2015, 3:42 PM

## 2015-11-02 MED ORDER — GLYCOPYRROLATE 1 MG PO TABS: 1.0000 mg | ORAL_TABLET | Freq: Three times a day (TID) | ORAL | Status: AC | PRN

## 2015-11-02 MED ORDER — IPRATROPIUM-ALBUTEROL 0.5-2.5 (3) MG/3ML IN SOLN: 3.0000 mL | RESPIRATORY_TRACT | Status: AC | PRN

## 2015-11-02 MED ORDER — POLYVINYL ALCOHOL 1.4 % OP SOLN: 2.0000 [drp] | OPHTHALMIC | Status: AC | PRN

## 2015-11-02 MED ORDER — BISACODYL 10 MG RE SUPP: 10.0000 mg | Freq: Every day | RECTAL | Status: AC | PRN

## 2015-11-02 MED ORDER — AMOXICILLIN-POT CLAVULANATE 250-62.5 MG/5ML PO SUSR: 500.0000 mg | Freq: Two times a day (BID) | ORAL | Status: DC

## 2015-11-02 MED ORDER — MORPHINE SULFATE (CONCENTRATE) 10 MG/0.5ML PO SOLN: 5.0000 mg | ORAL | Status: AC | PRN

## 2015-11-02 MED ORDER — AMOXICILLIN-POT CLAVULANATE 250-62.5 MG/5ML PO SUSR: 500.0000 mg | Freq: Two times a day (BID) | ORAL | Status: AC

## 2015-11-02 MED ORDER — METOPROLOL TARTRATE 25 MG PO TABS: 12.5000 mg | ORAL_TABLET | Freq: Two times a day (BID) | ORAL | Status: AC

## 2015-11-02 MED ORDER — ACETAMINOPHEN 325 MG PO TABS: 650.0000 mg | ORAL_TABLET | Freq: Four times a day (QID) | ORAL | Status: AC | PRN

## 2015-11-02 MED ORDER — LORAZEPAM 0.5 MG PO TABS: 0.5000 mg | ORAL_TABLET | ORAL | Status: AC | PRN

## 2015-11-02 MED ORDER — PROCHLORPERAZINE 25 MG RE SUPP: 25.0000 mg | Freq: Three times a day (TID) | RECTAL | Status: AC | PRN

## 2015-11-02 NOTE — Progress Notes (Addendum)
CSW was informed by MD Orvan Falconerampbell that patient's family has agreed that [facility] for patient at this time. CSW informed Clydie BraunKaren- [facility] of [address] [place] of above. CSW will arrange EMS transport for patient. Patient will discharge to Hospice Home today. CSW informed Jomarie LongsJoseph- Admissions Coordinator at [facility] of above.   Woodroe Modehristina Esterlene Atiyeh, MSW, LCSW-A Clinical Social Work Department [phone]

## 2015-11-02 NOTE — Progress Notes (Signed)
New Hospice home referral received from [place]. Maurice Carney is a 82 year old man admitted from [facility] for treatment of renal failure,back pain and lower extremity edema . He was found to have a distended bladder, foley placed with 3000cc drained.  Left lower extremity venous doppler ultrasound revealed several DVT's and he a was started on heparin for treatment, then Eliquis. On 5/30 he had an episode of decreased oxygen saturation with suspected aspiration requiring a non-rebreather mask, oxygen saturations improved and he was transitioned to nasal cannula. He has however continued with poor po intake, lethargy and weakness. He has been followed by Palliative care and afer a meeting today with his daughter, who is his HCPOA , the choice was made to focus on full comfort with discontinuation of IV antibiotics, fluids and all non-comfort medications.  Writer met in the patient's room with his daughter Maurice Carney, wife at bedside, to initiate education regarding hospice services, philosophy and team approach to care with good understanding voiced. Questions answered. Consents signed. Of note patient's wife has dementia and was present but did not participate in the conversation. She requires assistance with her walker and according to Carol's report thinks that [name] is her father, not her husband. Patient remained asleep through out the visit. He did moan occasionally. He has received one dose of robinul orally and 2 doses of liquid morphine on 5/31 for dyspnea. Hospital care team and family aware and in agreement with transfer to the Hospice home today for EOL care and management of dyspnea and pain. Patient information faxed to referral, report called to the hospice home, EMS notified for transport. Thank you for the opportunity to be involved in the care or this patient and his family. Flo Shanks RN, BSN, [place] [facility] [place], hospital  Liaison [phone]

## 2015-11-02 NOTE — Progress Notes (Addendum)
Palliative Medicine Inpatient Consult Follow Up Note   Name: Maurice Carney Date: 11/02/2015 MRN: [id number]  DOB: 1916-12-07  Referring Physician: Milagros Loll, MD  Palliative Care consult requested for this 82 y.o. male for goals of medical therapy in patient with aspiration pneumonia and bilateral DVTs (extensive).  DISCUSSIONS AND PLAN: Talked with daughter at length and she is asking for pt to be under the care of Hospice at this time.  The question was whether he should be under Hospice Care at [facility], where he has resided recently with his wife who is also there, or at [facility]. The patient is weaker and is not felt to be a rehab candidate at all since he desats with any degree of exercise of work.  He has been on a 'MODIFIED Comfort Care' approach for two days now --since overall, the daughter and rest of family and pt were wanting comfort care, but when the CXR came back looking like pneumonia (aspiration most likely), it seemed that ABX might make a difference. Daughter wanted to give him a couple of days to show [place] which direction he was likely to go in --either the recovery direction or else a more imminently terminal direction.    Today, given his worsening weakness and his tachypnea (despite good O2 sats, he is tachypneic and he uses some accessory muscles), he is felt to be unable to recover from this combination of pneumonia and bilateral extensive DVTs.  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  The tachypnea is a clue that pulmonary emboli may indeed be present.  He is not known to be a CO2 retainer and does not carry the diagnosis of COPD.  He just has little residual lung function due to remote TB.  His oxygen is being tapered down at this time, and he should be able to be on about 2 LPM instead of the 6 LPM he required after aspirating two days ago. He feels short of breath even when his sats are OK. He has chronic and acute back pain and also leg pain from the extenisive DVTs.   I have talked with his daughter about DC meds. She is aware they will be comfort oriented. She asked if he could have a few days of oral ABX and I have ordered this after first discussing this with Dr. Julio Alm and also with Hospice Liaison.  His Eliquis is DCd.  His glaucoma eye drops can be continued if the family desires (but they would need to bring in his home drops) --otherwise, he will be getting liquid tears prn.  He responds to the Roxanol and the Robinul and has gotten both of these while here.    I have updated attending, Hospice Liaison, nursing, and Social Worker.   I have made sure a DNR form is in chart.  I have done the DC med recs and placed a list of these in the paper chart (from the AVS).  Do not remove foley. It was placed here and left in by Urology due to retention.  Foley was to be removed at outpt appt with urologist. That won't happen now.  Hospice Staff should be aware that pts granddaughter, a Publishing rights manager, is arriving on a plane from [place] on June 2nd.  There has been much conflict between this granddaughhter and the daughter, Biagio Borg ( a nurse and the current POA and HCPOA for pt).  The granddaughter has been texting to the daughter about her ideas of what needs to be ordered (barium swallow, etc). Daughter does NOT want granddaughter to have access to pts detailed medical records. She wants to heal wounds and have this be a good time for all of them to come together, but she does not wish to permit granddaughter to get control of decision making, etc.       CLINICAL NARRATIVE: Maurice Carney is a 82 yo man who had been  living at [facility] with his wife when he was brought to the ED with a complaint of leg edema and back pain. He had been short of breath recently as well. He was given a dose of lasix 40 mg by [employer] provider. He was found to have bilateral DVTs on lower extremity dopplers and the clot burden was high with clot throughout the left leg venous system and clot also in the contralateral right common femoral vein. He was noted to be in acute renal failure and was found to have urinary retention but no hydronephrosis on renal US. A foley was placed and Urology was consulted recommending pt have an outpt appt for Foley removal and reassessment. Cardiololgy saw pt due to elevated troponins felt due to ARF and demand ischemia. Conservate management was recommended. He actually had TWO echos performed and the reports vary a bit, likely due to technical difficulty. Moderate aortic stenosis is noted in one record.  Systolic function is preserved overall..  Pt was to go to [facility] for rehab, but there was a question as to how aggressive physicians should be given pts advanced age and his recent fall history (4-5 falls recently). He did not get a V/Q scan b/c it was felt that whether or not he has a PE, the treatment would be the same. The question of an IVC filter arose and this was to be discussed with daughter, who is a Engineer, civil (consulting) working for [employer] (She is actually a [employer] doing this job).   A Palliative Care Consult was requestedHarold Carney is a 82 yo man who had been living at [facility] with his wife when he was brought to the ED with a complaint of leg edema and back pain. He had been short of breath recently as well. He was given a dose of lasix 40 mg by [employer] provider. He was found to have bilateral DVTs on lower extremity dopplers and the clot burden was high with clot throughout the left leg venous system and clot also in the contralateral  right common femoral vein. He was noted to be in acute renal failure and was found to have urinary retention but no hydronephrosis on renal US. A foley was placed and Urology was consulted recommending pt have an outpt appt for Foley removal and reassessment. Cardiololgy saw pt due to elevated troponins felt due to ARF and demand ischemia. Conservate management was recommended. He actually had TWO echos performed and the reports vary a bit, likely due to technical difficulty. Moderate aortic stenosis is noted in one record.  Systolic function is preserved overall..  Pt was to go to [facility] for rehab, but there was a question as  to how aggressive physicians should be given pts advanced age and his recent fall history (4-5 falls recently). He did not get a V/Q scan b/c it was felt that whether or not he has a PE, the treatment would be the same. The question of an IVC filter arose and this was to be discussed with daughter, who is a Engineer, civil (consulting)nurse working for [employer] (She is actually a [employer] employee doing this job).   A Palliative Care Consult was requested. Yesterday, pt developed acute respiratory failure and he was made DNR and MODIFIED COMFORT CARE. The 'modified' component of the comfort care plan is due to the fact that his CXR suggests an acute pneumonia (possibly aspiration?) and so IV Zosyn is ordered. Also, Eliquis continues as do his glaucoma meds. Pt had neither improved nor worsened since late yesterday, so daughter wishes to go one more day with this plan and then reassess again.   Pt was worse on rounds on 6/1. Weaker. Grimacing and moaning in his sleep.  Tachypneic even with good sats.       ACTIVE PROBLEMS: Aspiration Pneumonia Extensive bilateral DVTS Probably pulmonary emboli (but cannot say with certainty and will not be testing for this) Dysphagia Profound weakness Acute respiratory failure with hypoxia  ---also a sense of shortness of breath even when sats are OK ---pt  desats with any activity but sats are ok at rest ---due to right lung not functional since TB years ago and acute pneumonia, atelectasis, and possible/ probably PEs HTN HLD Mild Alzheimer's Dementia Acute on Chronic Kidney disease  Moderate Aortic Stenosis Urinary Retention due to BPH   REVIEW OF SYSTEMS:  Patient is not able to provide ROS due to weakness and shortness of breath  CODE STATUS: DNR   PAST MEDICAL HISTORY: Past Medical History  Diagnosis Date  . Hypertension   . Hyperlipidemia   . Alzheimer disease   . MDD (major depressive disorder) (HCC)   . CKD (chronic kidney disease)   . Demand ischemia (HCC) 10/29/2015  . Essential hypertension 10/29/2015  . PVC (premature ventricular contraction) 10/29/2015  . Moderate aortic stenosis 10/29/2015    PAST SURGICAL HISTORY:  Past Surgical History  Procedure Laterality Date  . Transurethral resection of prostate      Vital Signs: BP 147/54 mmHg  Pulse 66  Temp(Src) 98.7 F (37.1 C) (Oral)  Resp 22  Ht 6\' 4"  (1.93 m)  Wt 79.379 kg (175 lb)  BMI 21.31 kg/m2  SpO2 96% Filed Weights   10/27/15 1101  Weight: 79.379 kg (175 lb)    Estimated body mass index is 21.31 kg/(m^2) as calculated from the following:   Height as of this encounter: 6\' 4"  (1.93 m).   Weight as of this encounter: 79.379 kg (175 lb).  PHYSICAL EXAM: He appears much weaker --wakens to talk briefly, but is very lethargic and goes right back to sleep He woke and verbally agreed with Hospice Home saying 'that would be wonderful'. ---he had previously agreed 'in general' to this but is wasn't decided with certainty till today/ now. Before he awoke, resp rate was noted to be 30 by me.  And, he moaned in his sleep.  Eyes are weak Nares patent OP is dry No JVF or TM Hrt rrr rate 66 Lungs cta Abd soft and Nt Ext no cyanosis or mottling  --muscle atrophy noted  LABS: CBC:    Component Value Date/Time   WBC 10.0 10/31/2015 0511   HGB 9.1*  10/31/2015 0511  HCT 27.7* 10/31/2015 0511   PLT 256 10/31/2015 0511   MCV 87.3 10/31/2015 0511   NEUTROABS 13.0* 10/27/2015 0925   LYMPHSABS 1.6 10/27/2015 0925   MONOABS 1.0 10/27/2015 0925   EOSABS 0.1 10/27/2015 0925   BASOSABS 0.1 10/27/2015 0925   Comprehensive Metabolic Panel:    Component Value Date/Time   NA 141 10/31/2015 0511   K 4.3 10/31/2015 0511   CL 114* 10/31/2015 0511   CO2 22 10/31/2015 0511   BUN 43* 10/31/2015 0511   CREATININE 1.69* 10/31/2015 0511   GLUCOSE 117* 10/31/2015 0511   CALCIUM 8.1* 10/31/2015 0511   ALBUMIN 2.2* 10/31/2015 0511     More than 50% of the visit was spent in counseling/coordination of care: YES  Time Spent:  65 min

## 2015-11-02 NOTE — Discharge Summary (Signed)
[facility] - [place] at [facility]   PATIENT NAME: Maurice Carney    MR#:  [id number]  DATE OF BIRTH:  04-Jul-1916  DATE OF ADMISSION:  10/27/2015 ADMITTING PHYSICIAN: Adrian Saran, MD  DATE OF DISCHARGE: No discharge date for patient encounter.  PRIMARY CARE PHYSICIAN: No primary care provider on file.   ADMISSION DIAGNOSIS:  Renal failure [N19] HCAP (healthcare-associated pneumonia) [J18.9] Acute bilateral deep vein thrombosis (DVT) of femoral veins (HCC) [I82.413]  DISCHARGE DIAGNOSIS:  Active Problems:   Renal failure   Demand ischemia (HCC)   Essential hypertension   Hyperlipidemia   PVC (premature ventricular contraction)   Moderate aortic stenosis   Acute bilateral deep vein thrombosis (DVT) of femoral veins (HCC)   Chronic anticoagulation   Dyspnea   SECONDARY DIAGNOSIS:   Past Medical History  Diagnosis Date  . Hypertension   . Hyperlipidemia   . Alzheimer disease   . MDD (major depressive disorder) (HCC)   . CKD (chronic kidney disease)   . Demand ischemia (HCC) 10/29/2015  . Essential hypertension 10/29/2015  . PVC (premature ventricular contraction) 10/29/2015  . Moderate aortic stenosis 10/29/2015     ADMITTING HISTORY  Maurice Carney is a 82 y.o. male with a known history of All tremors dementia, chronic back pain and essential hypertension who presents with above complaint. History of present illness is taken from daughter who is at bedside. Patient moved here a few months ago and is now living at assisted living. Yesterday patient was having shortness of breath and daughter noticed that his lower extremities were very edematous. His left lower external he was more edematous than the right. He was given a dose of 40 mg of Lasix by his physician from doctors making house calls. Due to the lower external he swelling and shortness of breath and weakness he presents today along with back pain. Patient reports that his back pain is now  controlled. He says he has had back pain for 50 years. Daughter also reports that his oxygen saturation was 85% on room air yesterday. After the dose of Lasix and elevating his legs yesterday his oxygen saturation was 92%. Today in the emergency department his O2 saturations are 96-100%. Lower extremity Dopplers were performed which were positive for DVT. He has not been very ambulatory for the past few months. Patient has a history of tuberculosis and has been treated in the past. He has chronic scarring of the right lung. He is noted to have urinary tract infection on UA today.  HOSPITAL COURSE:   * Bilateral Lower Ext DVT: was On IV heparin PO Eliquis Possible he could have PE also but would not change treatment plan at this point. IVC filter considered due to fall risk but patient Deteriorated prior to this procedure.  * Acute Renal Failure: Secondary to urinary retention and dehydration.  Resolved with IVF  * Aspiration pneumonia. Acute hypoxic respiratory failure Treated with IV Zosyn. Changed to oral Augmentin as per daughter's request discharge.  * Urinary Retention Urology has seen the patient. Plan was to have patient follow-up as outpatient for Foley removal and cystoscopy. Now will leave foley in for comfort.  * UTI Coagulase-negative Staphylococcus. Blood cultures negative. No endocarditis on echo.  * Elevated Trop: Likely secondary to ARF and demand ischemia. Echo has slight decreased EF but no wall motion abnormality. Cardiology evaluated and agree with current medical care. No need for further cardiology work up.  * Inpatient delirium over early dementia:  Due  to patient's deterioration Dr. Orvan Falconer with palliative care was requested to speak with daughter. Patient was made DO NOT RESUSCITATE and DO NOT INTUBATE by daughter who is his healthcare power of attorney. Later due to no improvement patient was put on comfort measures and decision made to transfer with  hospice.  Discussed with daughter.  CONSULTS OBTAINED:  Treatment Team:  Mady Haagensen, MD Annice Needy, MD Yevonne Pax, MD Bjorn Pippin, MD  DRUG ALLERGIES:   Allergies  Allergen Reactions  . Sulfa Antibiotics     DISCHARGE MEDICATIONS:   Current Discharge Medication List    START taking these medications   Details  amoxicillin-clavulanate (AUGMENTIN) 250-62.5 MG/5ML suspension Take 10 mLs (500 mg total) by mouth every 12 (twelve) hours. Qty: 150 mL, Refills: 0    bisacodyl (DULCOLAX) 10 MG suppository Place 1 suppository (10 mg total) rectally daily as needed for moderate constipation. Qty: 12 suppository, Refills: 0    glycopyrrolate (ROBINUL) 1 MG tablet Take 1 tablet (1 mg total) by mouth 3 (three) times daily as needed (secretions).    ipratropium-albuterol (DUONEB) 0.5-2.5 (3) MG/3ML SOLN Take 3 mLs by nebulization every 4 (four) hours as needed (shortness of breath or wheezing). Qty: 30 mL, Refills: 0    LORazepam (ATIVAN) 0.5 MG tablet Take 1 tablet (0.5 mg total) by mouth every 4 (four) hours as needed for anxiety. Qty: 15 tablet, Refills: 0    metoprolol tartrate (LOPRESSOR) 25 MG tablet Take 0.5 tablets (12.5 mg total) by mouth 2 (two) times daily. Qty: 30 tablet, Refills: 0    Morphine Sulfate (MORPHINE CONCENTRATE) 10 MG/0.5ML SOLN concentrated solution Take 0.25 mLs (5 mg total) by mouth every hour as needed for moderate pain, severe pain or shortness of breath. Qty: 30 mL, Refills: 0    polyvinyl alcohol (LIQUIFILM TEARS) 1.4 % ophthalmic solution Place 2 drops into both eyes every 4 (four) hours as needed for dry eyes. Qty: 15 mL, Refills: 0    prochlorperazine (COMPAZINE) 25 MG suppository Place 1 suppository (25 mg total) rectally every 8 (eight) hours as needed for nausea or vomiting. Qty: 6 suppository, Refills: 0      CONTINUE these medications which have CHANGED   Details  acetaminophen (TYLENOL) 325 MG tablet Take 2 tablets (650 mg total)  by mouth every 6 (six) hours as needed for mild pain (or Fever >/= 101).      STOP taking these medications     amLODipine (NORVASC) 5 MG tablet      aspirin EC 81 MG tablet      dorzolamide (TRUSOPT) 2 % ophthalmic solution      escitalopram (LEXAPRO) 10 MG tablet      lamoTRIgine (LAMICTAL) 25 MG tablet      latanoprost (XALATAN) 0.005 % ophthalmic solution      memantine (NAMENDA) 10 MG tablet      Multiple Vitamin (MULTIVITAMIN WITH MINERALS) TABS tablet      neomycin-bacitracin-polymyxin (NEOSPORIN) ointment      omega-3 acid ethyl esters (LOVAZA) 1 g capsule      polyethylene glycol (MIRALAX / GLYCOLAX) packet      simvastatin (ZOCOR) 40 MG tablet      zinc sulfate 220 (50 Zn) MG capsule         Today   VITAL SIGNS:  Blood pressure 147/54, pulse 66, temperature 98.7 F (37.1 C), temperature source Oral, resp. rate 22, height 6\' 4"  (1.93 m), weight 79.379 kg (175 lb), SpO2 96 %.  I/O:   Intake/Output Summary (Last 24 hours) at 11/02/15 1330 Last data filed at 11/02/15 1016  Gross per 24 hour  Intake    395 ml  Output   1600 ml  Net  -1205 ml    PHYSICAL EXAMINATION:  Physical Exam  GENERAL:  82 y.o.-year-old patient lying in the bed LUNGS: Coarse breath sounds. tachypneic PSYCHIATRIC: The patient is drowzy.  DATA REVIEW:   CBC  Recent Labs Lab 10/31/15 0511  WBC 10.0  HGB 9.1*  HCT 27.7*  PLT 256    Chemistries   Recent Labs Lab 10/31/15 0511  NA 141  K 4.3  CL 114*  CO2 22  GLUCOSE 117*  BUN 43*  CREATININE 1.69*  CALCIUM 8.1*    Cardiac Enzymes  Recent Labs Lab 10/29/15 1056  TROPONINI 3.21*    Microbiology Results  Results for orders placed or performed during the hospital encounter of 10/27/15  Blood culture (routine x 2)     Status: None   Collection Time: 10/27/15  9:25 AM  Result Value Ref Range Status   Specimen Description BLOOD LEFT FOREARM  Final   Special Requests BOTTLES DRAWN AEROBIC AND ANAEROBIC   10CC  Final   Culture NO GROWTH 5 DAYS  Final   Report Status 11/01/2015 FINAL  Final  Blood culture (routine x 2)     Status: None   Collection Time: 10/27/15  9:25 AM  Result Value Ref Range Status   Specimen Description BLOOD LEFT ASSIST CONTROL  Final   Special Requests BOTTLES DRAWN AEROBIC AND ANAEROBIC  7CC  Final   Culture NO GROWTH 5 DAYS  Final   Report Status 11/01/2015 FINAL  Final  Urine culture     Status: Abnormal   Collection Time: 10/27/15  9:25 AM  Result Value Ref Range Status   Specimen Description URINE, RANDOM  Final   Special Requests NONE  Final   Culture (A)  Final    >=100,000 COLONIES/mL STAPHYLOCOCCUS SPECIES (COAGULASE NEGATIVE)   Report Status 10/29/2015 FINAL  Final   Organism ID, Bacteria STAPHYLOCOCCUS SPECIES (COAGULASE NEGATIVE) (A)  Final      Susceptibility   Staphylococcus species (coagulase negative) - MIC*    CIPROFLOXACIN 2 INTERMEDIATE Intermediate     GENTAMICIN <=0.5 SENSITIVE Sensitive     NITROFURANTOIN <=16 SENSITIVE Sensitive     OXACILLIN Value in next row Resistant      >=4 RESISTANTWARNING: For oxacillin-resistant S.aureus and coagulase-negative staphylococci (MRS), other beta-lactam agents, ie, penicillins, beta-lactam/beta-lactamase inhibitor combinations, cephems (with the exception of the cephalosporins with anti-MRSA activity), and carbapenems, may appear active in vitro, but are not effective clinically.  --CLSI, Vol.32 No.3, January 2012, pg 70.    TETRACYCLINE Value in next row Sensitive      >=4 RESISTANTWARNING: For oxacillin-resistant S.aureus and coagulase-negative staphylococci (MRS), other beta-lactam agents, ie, penicillins, beta-lactam/beta-lactamase inhibitor combinations, cephems (with the exception of the cephalosporins with anti-MRSA activity), and carbapenems, may appear active in vitro, but are not effective clinically.  --CLSI, Vol.32 No.3, January 2012, pg 70.    VANCOMYCIN Value in next row Sensitive      >=4  RESISTANTWARNING: For oxacillin-resistant S.aureus and coagulase-negative staphylococci (MRS), other beta-lactam agents, ie, penicillins, beta-lactam/beta-lactamase inhibitor combinations, cephems (with the exception of the cephalosporins with anti-MRSA activity), and carbapenems, may appear active in vitro, but are not effective clinically.  --CLSI, Vol.32 No.3, January 2012, pg 70.    TRIMETH/SULFA Value in next row Sensitive      >=  4 RESISTANTWARNING: For oxacillin-resistant S.aureus and coagulase-negative staphylococci (MRS), other beta-lactam agents, ie, penicillins, beta-lactam/beta-lactamase inhibitor combinations, cephems (with the exception of the cephalosporins with anti-MRSA activity), and carbapenems, may appear active in vitro, but are not effective clinically.  --CLSI, Vol.32 No.3, January 2012, pg 70.    CLINDAMYCIN Value in next row Sensitive      >=4 RESISTANTWARNING: For oxacillin-resistant S.aureus and coagulase-negative staphylococci (MRS), other beta-lactam agents, ie, penicillins, beta-lactam/beta-lactamase inhibitor combinations, cephems (with the exception of the cephalosporins with anti-MRSA activity), and carbapenems, may appear active in vitro, but are not effective clinically.  --CLSI, Vol.32 No.3, January 2012, pg 70.    RIFAMPIN Value in next row Sensitive      >=4 RESISTANTWARNING: For oxacillin-resistant S.aureus and coagulase-negative staphylococci (MRS), other beta-lactam agents, ie, penicillins, beta-lactam/beta-lactamase inhibitor combinations, cephems (with the exception of the cephalosporins with anti-MRSA activity), and carbapenems, may appear active in vitro, but are not effective clinically.  --CLSI, Vol.32 No.3, January 2012, pg 70.    * >=100,000 COLONIES/mL STAPHYLOCOCCUS SPECIES (COAGULASE NEGATIVE)  MRSA PCR Screening     Status: None   Collection Time: 10/27/15  5:14 PM  Result Value Ref Range Status   MRSA by PCR NEGATIVE NEGATIVE Final    Comment:         The GeneXpert MRSA Assay (FDA approved for NASAL specimens only), is one component of a comprehensive MRSA colonization surveillance program. It is not intended to diagnose MRSA infection nor to guide or monitor treatment for MRSA infections.     RADIOLOGY:  Dg Chest Port 1 View  10/31/2015  CLINICAL DATA:  Hypoxia. EXAM: PORTABLE CHEST 1 VIEW COMPARISON:  Oct 29, 2015. FINDINGS: Stable cardiomediastinal silhouette. Stable diffuse opacification is noted in right lung with calcified pleural plaques and other pleural thickening. New left basilar airspace opacity is noted concerning for pneumonia or possibly edema. No pneumothorax is noted. Bony thorax is unremarkable. IMPRESSION: Stable chronic changes are noted in the right hemithorax compared to prior exam. New left basilar opacity is noted concerning for pneumonia or possibly edema. Electronically Signed   By: Lupita RaiderJames  Green Jr, M.D.   On: 10/31/2015 15:38    Follow up with PCP in 1 week.  Management plans discussed with the patient, family and they are in agreement.  CODE STATUS:     Code Status Orders        Start     Ordered   10/31/15 1555  Do not attempt resuscitation (DNR)   Continuous    Question Answer Comment  In the event of cardiac or respiratory ARREST Do not call a "code blue"   In the event of cardiac or respiratory ARREST Do not perform Intubation, CPR, defibrillation or ACLS   In the event of cardiac or respiratory ARREST Use medication by any route, position, wound care, and other measures to relive pain and suffering. May use oxygen, suction and manual treatment of airway obstruction as needed for comfort.   Comments DNR and DNI (no HI FLow and No BIPAP)      10/31/15 1554    Code Status History    Date Active Date Inactive Code Status Order ID Comments User Context   10/27/2015 11:55 AM 10/31/2015  3:54 PM Full Code [id number]  Adrian SaranSital Mody, MD ED    Advance Directive Documentation        Most Recent Value    Type of Advance Directive  Healthcare Power of Attorney, Living will   Pre-existing out of facility  DNR order (yellow form or pink MOST form)     "MOST" Form in Place?        TOTAL TIME TAKING CARE OF THIS PATIENT ON DAY OF DISCHARGE: more than 30 minutes.   Milagros Loll R M.D on 11/02/2015 at 1:30 PM  Between 7am to 6pm - Pager - [phone]  After 6pm go to [email or web address] - password [email or web address] [facility]  [facility]  Office  [phone]  CC: Primary care physician; No primary care provider on file.  Note: This dictation was prepared with Dragon dictation along with smaller phrase technology. Any transcriptional errors that result from this process are unintentional.

## 2015-11-14 ENCOUNTER — Other Ambulatory Visit: Payer: Self-pay | Admitting: Urology

## 2015-12-02 DEATH — deceased

## 2016-12-22 IMAGING — DX DG CHEST 1V PORT
1 series · 2 of 2 positions shown · non-contrast
Comparison: 10/27/2015

CLINICAL DATA: Renal failure, history of tuberculosis in the right
lung

EXAM:
PORTABLE CHEST 1 VIEW

[Series 1: chest ap · 0.14mm/px · 2 of 2 slices shown]
[im 1/2]
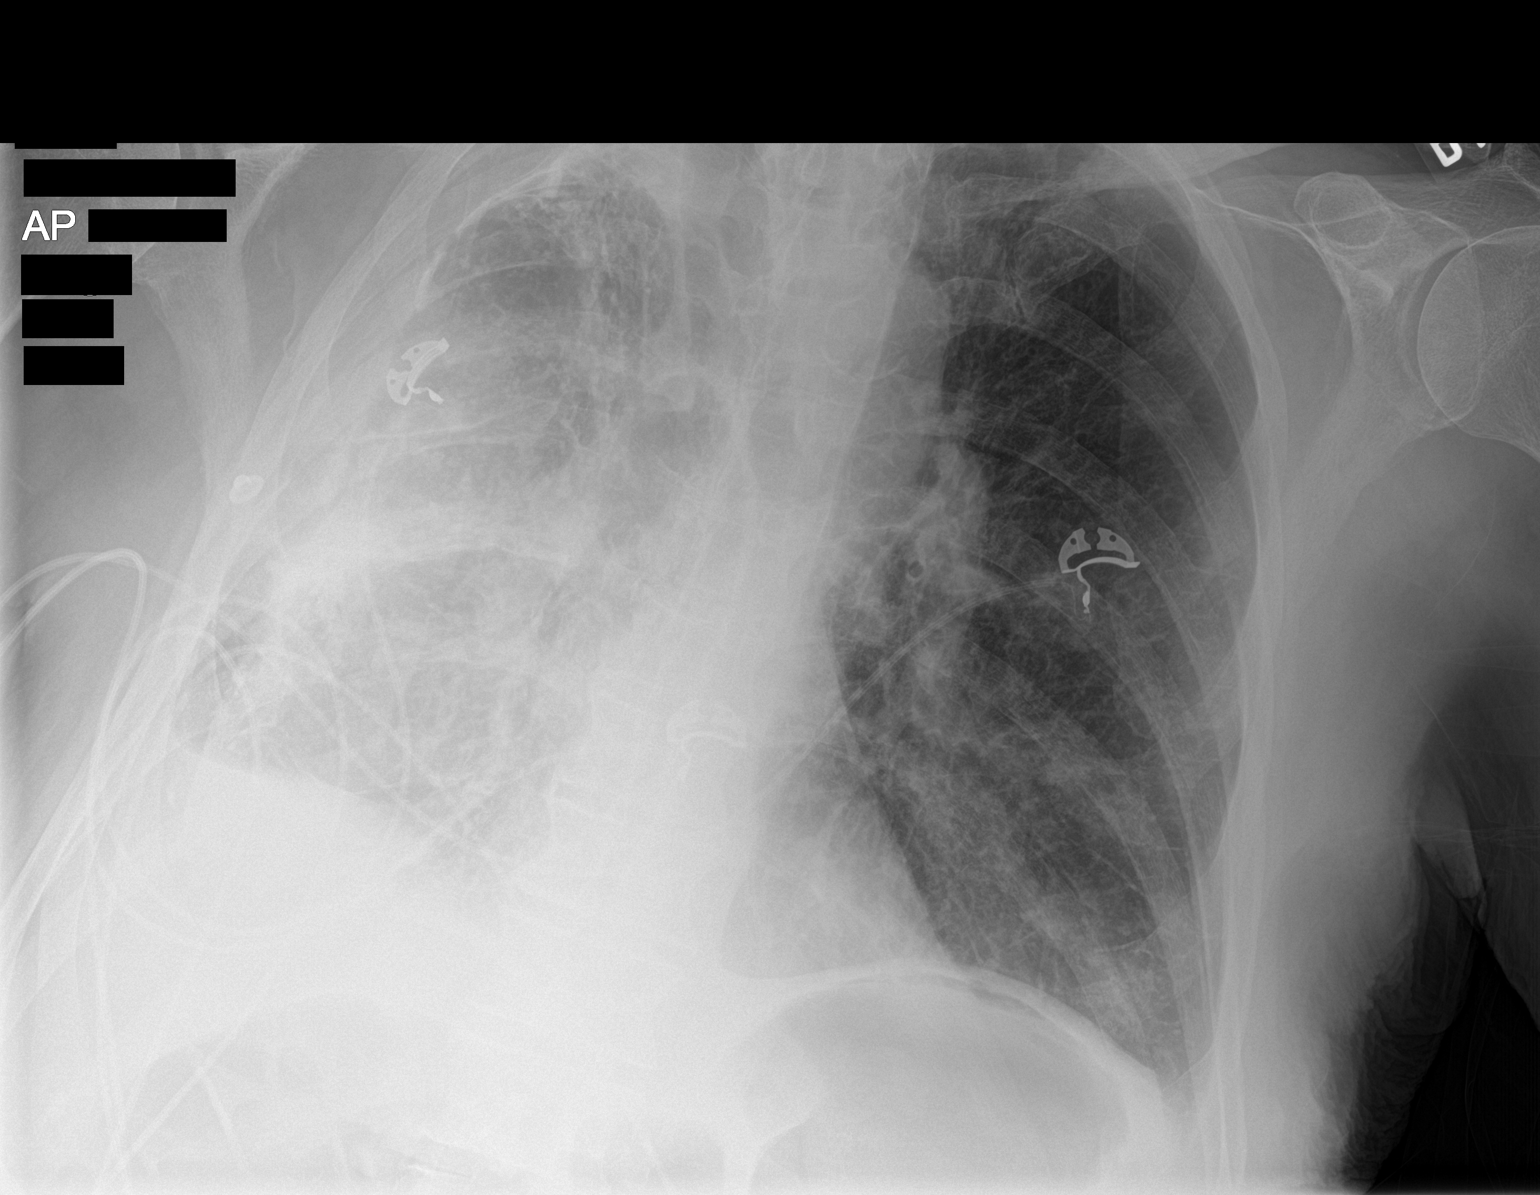
[im 2/2]
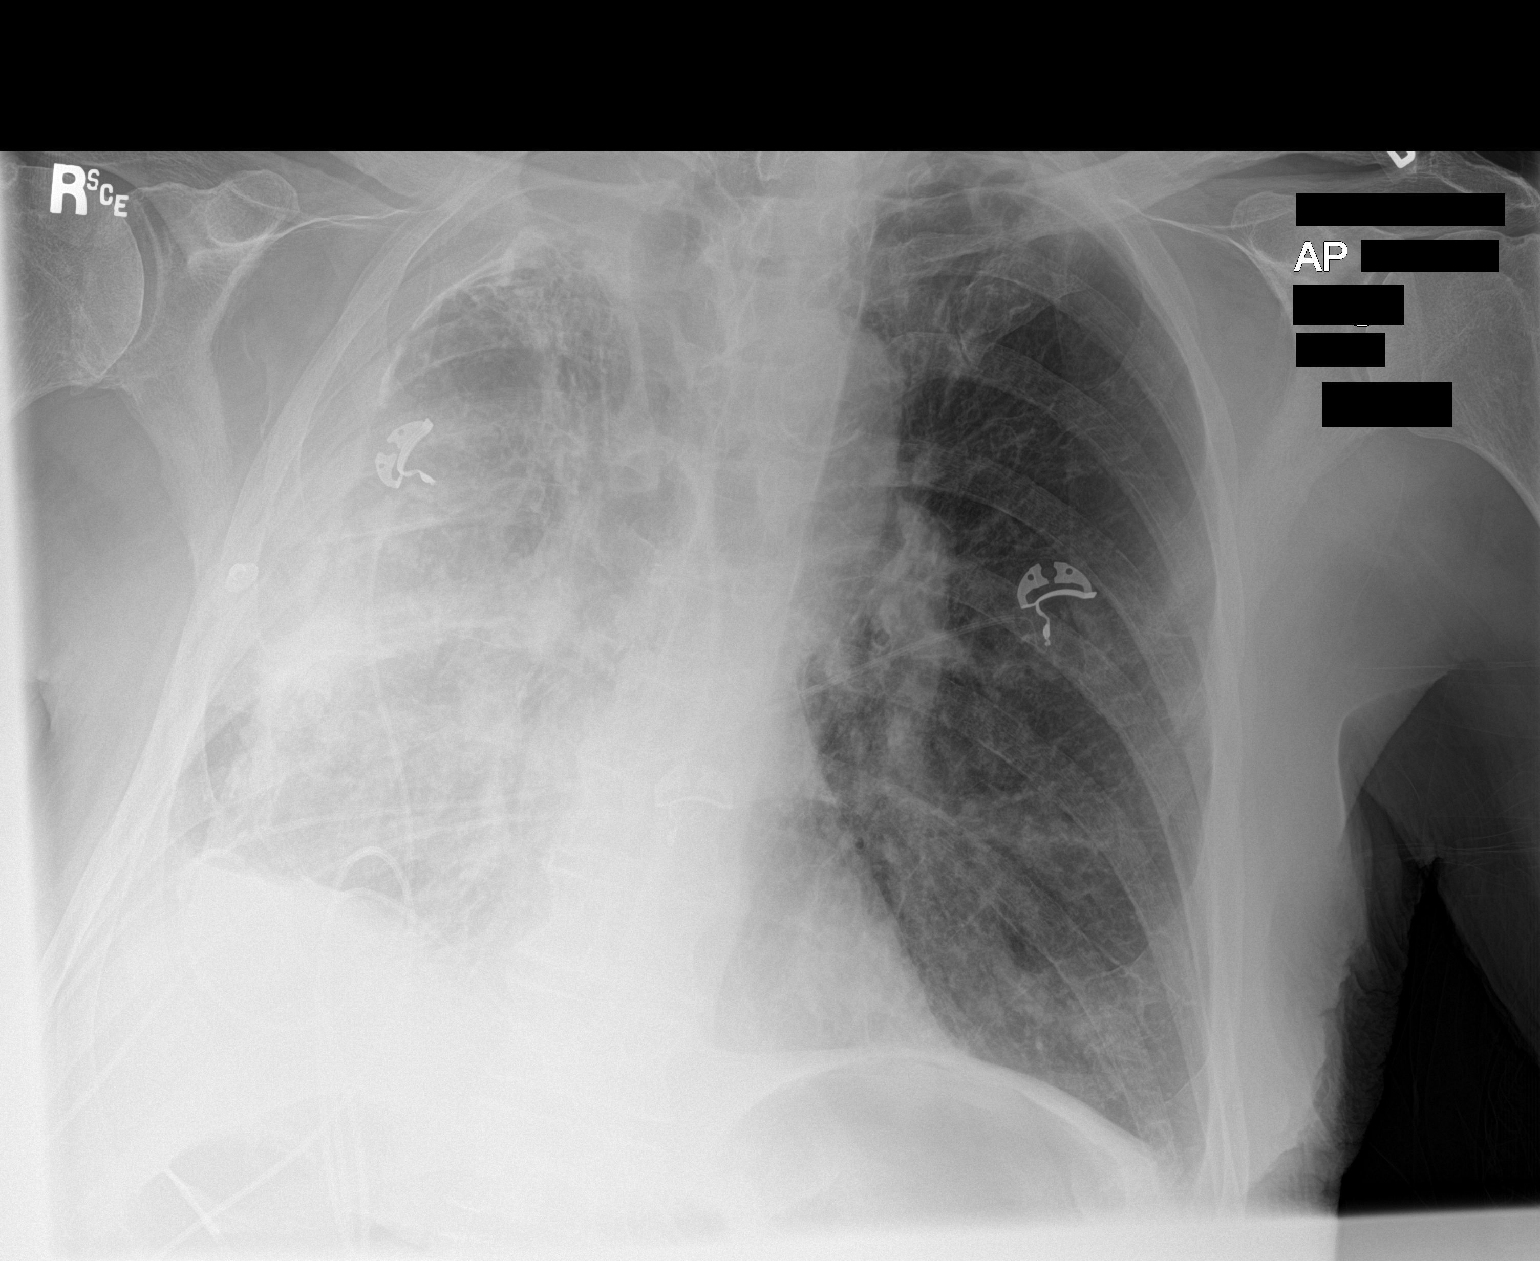

[2 of 2 positions shown; findings below may reference images not displayed]

FINDINGS: Cardiac shadow is stable. Chronic changes in the right hemi thorax
are again noted and stable. The left lung remains clear. No sizable
effusion or focal infiltrate is noted. No acute bony abnormality is
seen.
IMPRESSION: Chronic changes in the right hemi thorax stable from the prior exam.

## 2017-01-22 IMAGING — US US RENAL
1 series · 14 of 25 positions shown · non-contrast
Comparison: None.

CLINICAL DATA: Renal failure

EXAM:
RENAL / URINARY TRACT ULTRASOUND COMPLETE

[Series 1: us renal · 0.22mm/px · 14 of 52 slices shown]
[im 1/52]
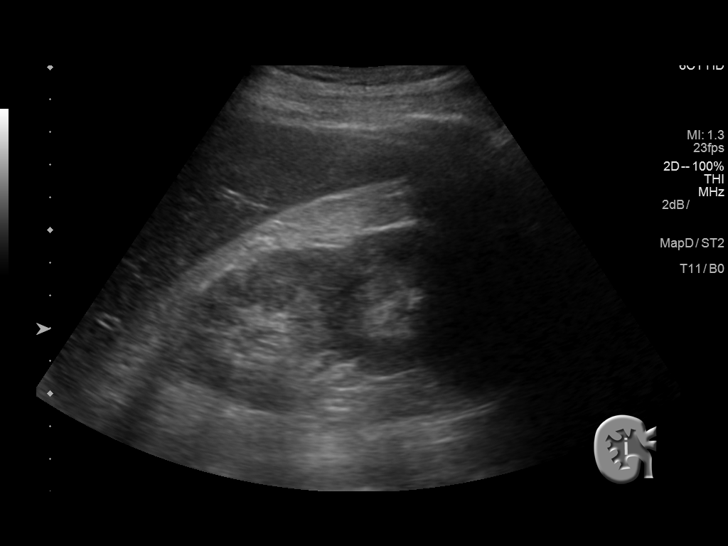
[im 5/52]
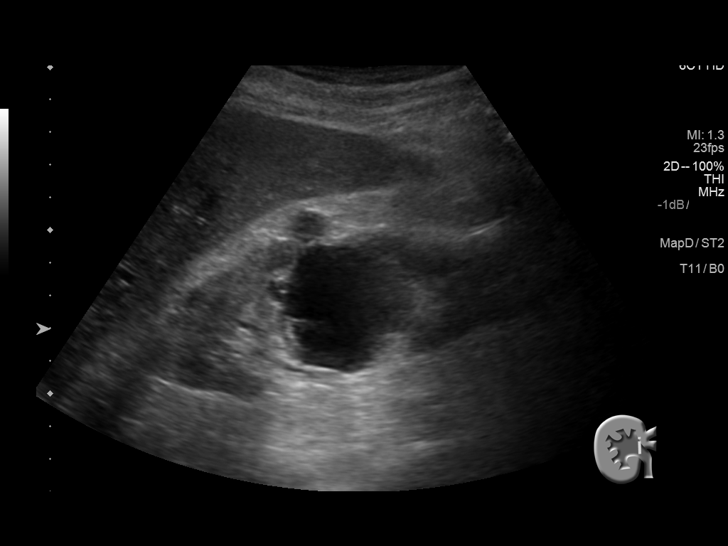
[im 9/52]
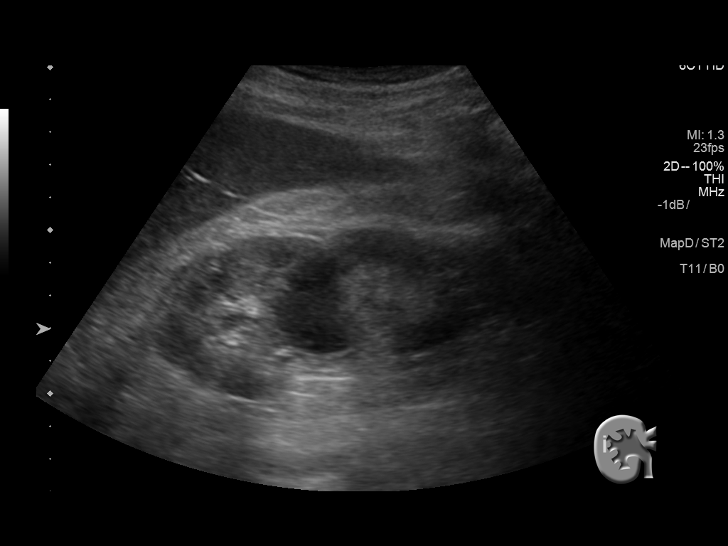
[im 13/52]
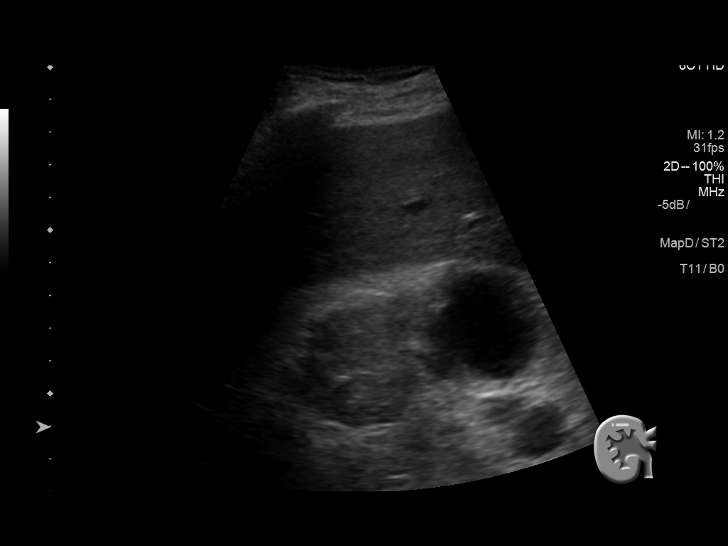
[im 18/52]
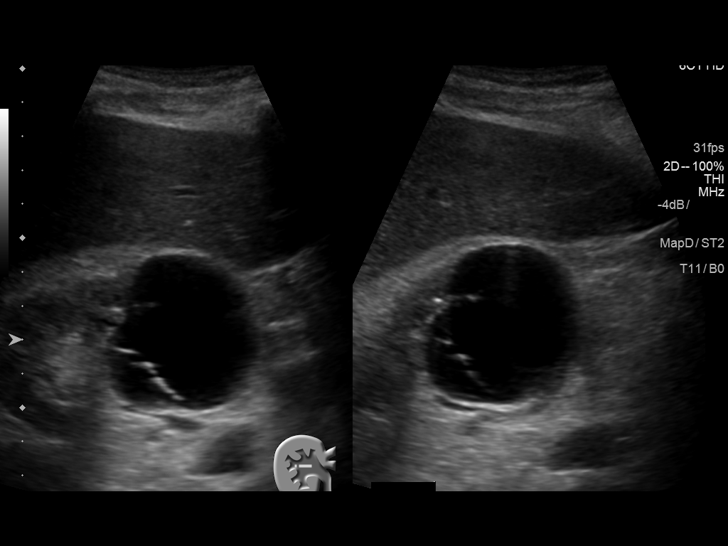
[im 20/52]
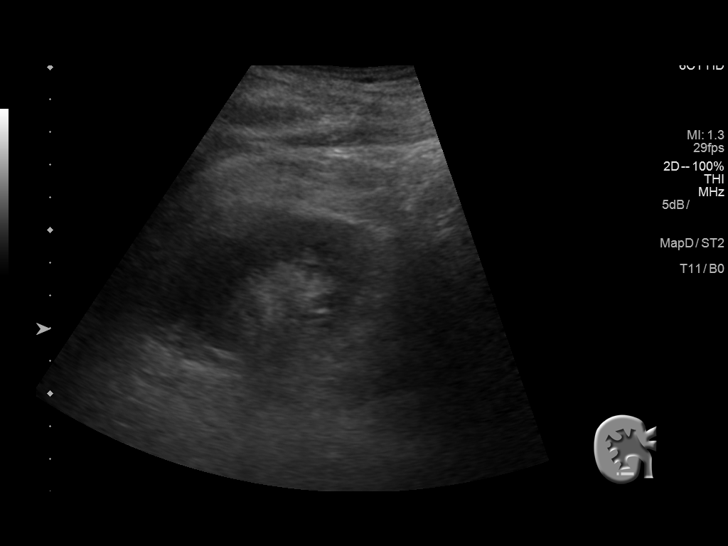
[im 24/52]
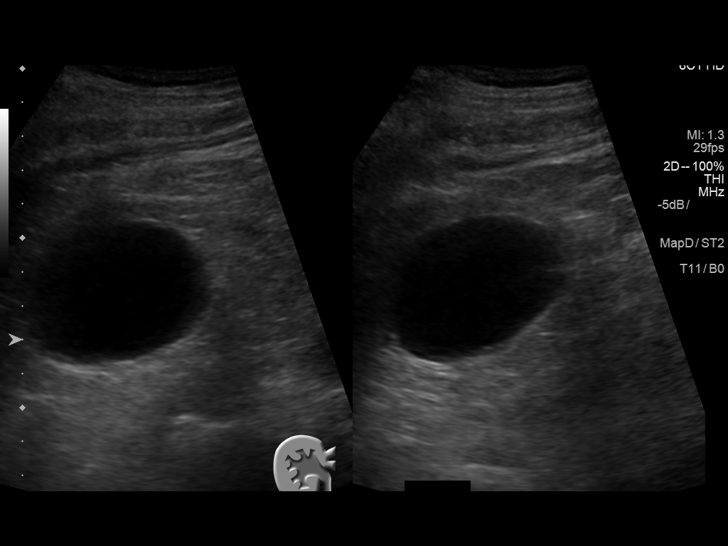
[im 28/52]
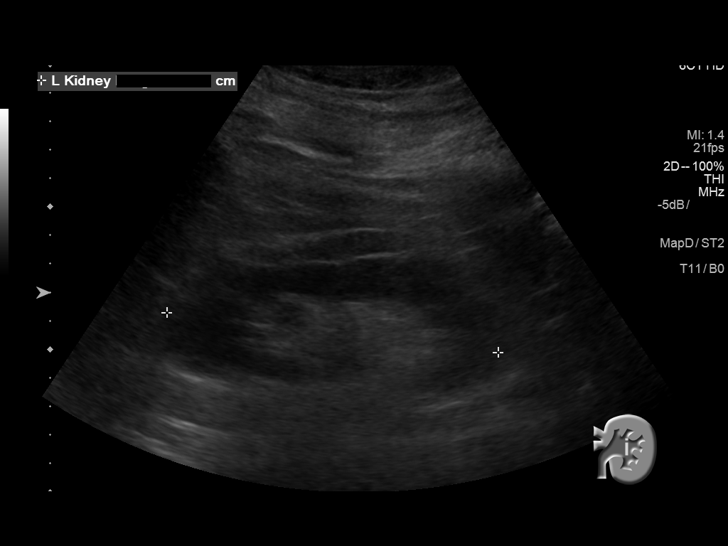
[im 32/52]
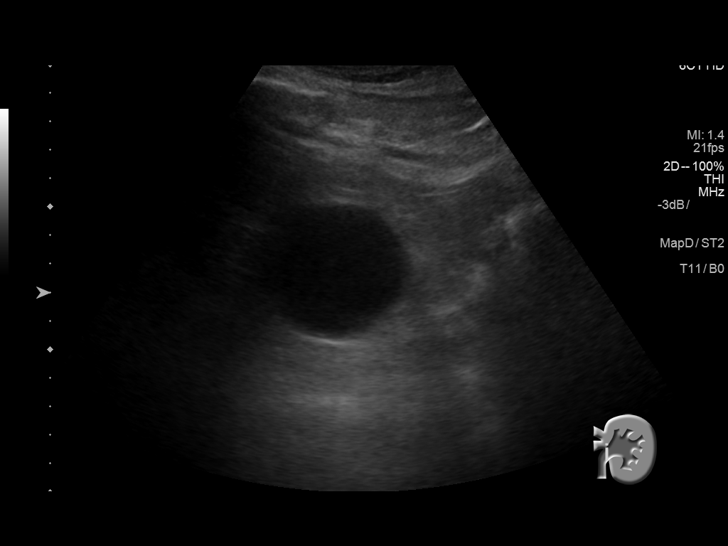
[im 35/52]
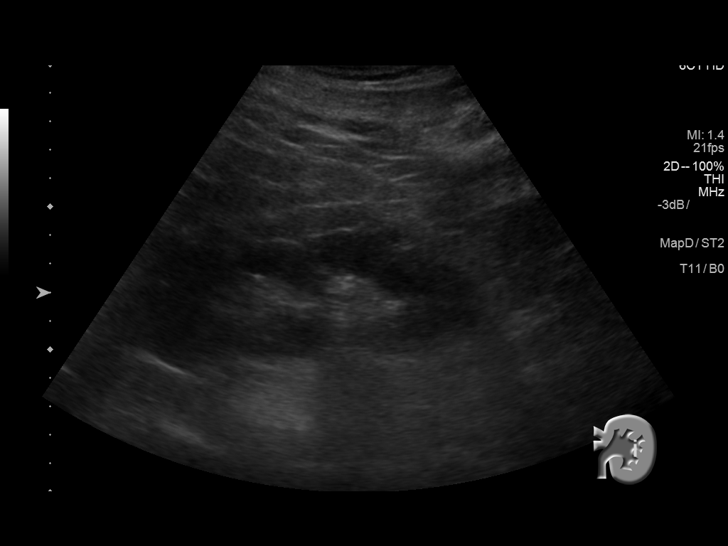
[im 39/52]
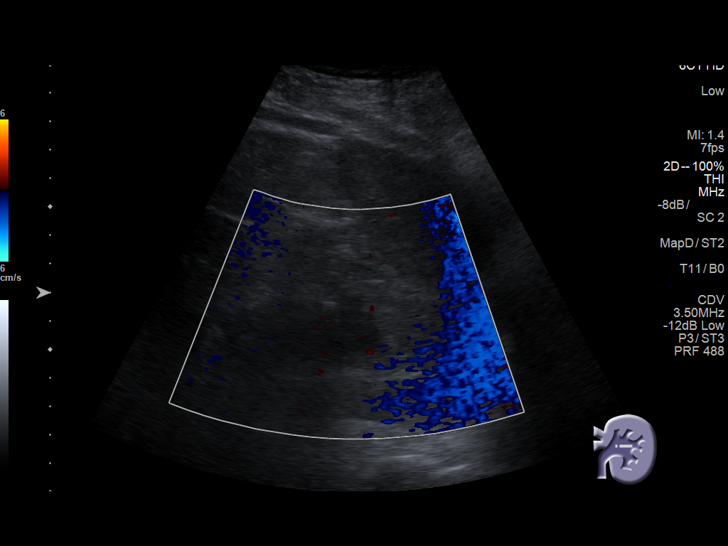
[im 43/52]
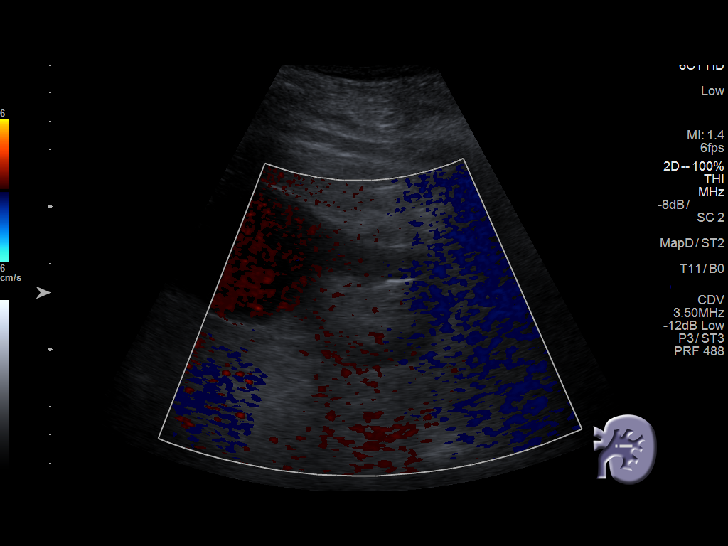
[im 47/52]
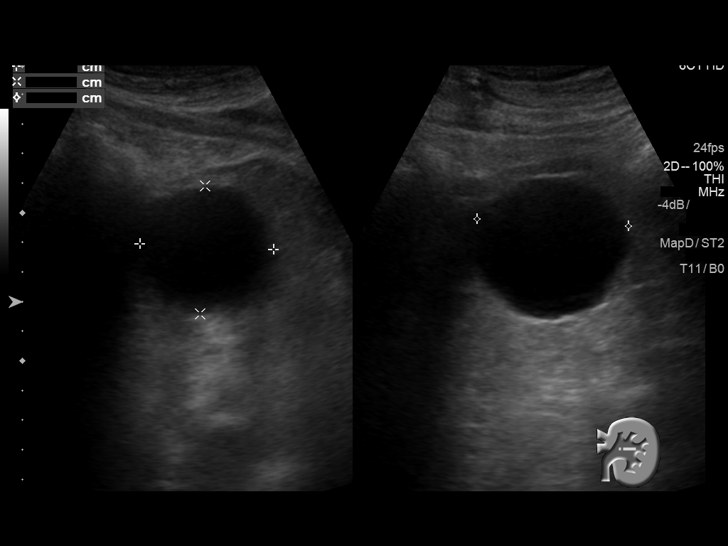
[im 52/52]
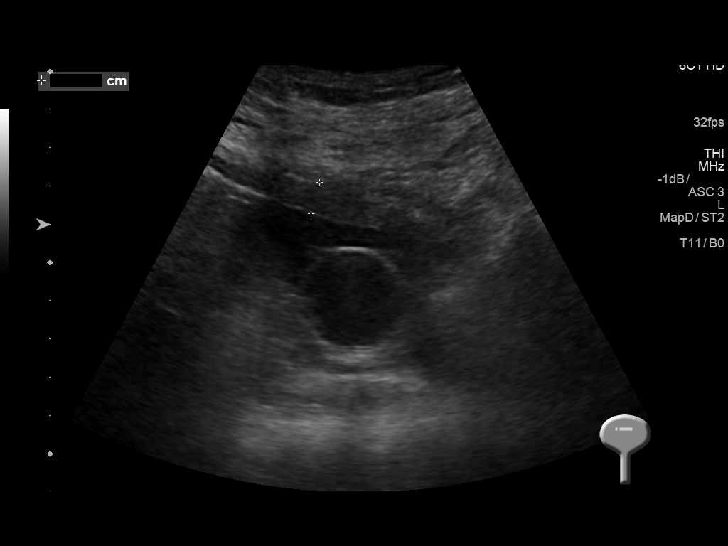

[14 of 25 positions shown; findings below may reference images not displayed]

FINDINGS: Right Kidney:

Length: 12.4 cm. Echogenicity within normal limits. No
hydronephrosis visualized. 5.4 x 4 x 5.3 cm anechoic right renal
mass consistent with a cyst. 4.6 x 4.5 x 4.4 cm anechoic right renal
mass with thin septations most consistent with a mildly complicated
cyst.

Left Kidney:

Length: 11.7 cm. Echogenicity within normal limits. No
hydronephrosis visualized. Two anechoic left renal masses measuring
2.5 x 3 x 2.7 cm and 4.5 x 4.3 x 5.1 cm respectively.

Bladder:

Bladder wall thickening likely secondary to underdistention with a
Foley catheter present.
IMPRESSION: 1. No obstructive uropathy.
2. Multiple bilateral renal cysts.
3. Mildly complicated right renal cyst with thin internal
septations.
